# Patient Record
Sex: Female | Born: 1965
Health system: Southern US, Community
[De-identification: ages and names within clinical notes are randomized; demographics above are authoritative.]

## PROBLEM LIST (undated history)

## (undated) DIAGNOSIS — Z8619 Personal history of other infectious and parasitic diseases: Secondary | ICD-10-CM

## (undated) DIAGNOSIS — T7840XA Allergy, unspecified, initial encounter: Secondary | ICD-10-CM

## (undated) DIAGNOSIS — I341 Nonrheumatic mitral (valve) prolapse: Secondary | ICD-10-CM

## (undated) DIAGNOSIS — E119 Type 2 diabetes mellitus without complications: Secondary | ICD-10-CM

## (undated) DIAGNOSIS — O2230 Deep phlebothrombosis in pregnancy, unspecified trimester: Secondary | ICD-10-CM

## (undated) HISTORY — DX: Personal history of other infectious and parasitic diseases: Z86.19

## (undated) HISTORY — DX: Nonrheumatic mitral (valve) prolapse: I34.1

## (undated) HISTORY — DX: Allergy, unspecified, initial encounter: T78.40XA

## (undated) HISTORY — DX: Deep phlebothrombosis in pregnancy, unspecified trimester: O22.30

---

## 1998-06-11 DIAGNOSIS — O2230 Deep phlebothrombosis in pregnancy, unspecified trimester: Secondary | ICD-10-CM

## 1998-06-11 HISTORY — PX: OTHER SURGICAL HISTORY: SHX169

## 1998-06-11 HISTORY — DX: Deep phlebothrombosis in pregnancy, unspecified trimester: O22.30

## 2009-09-02 ENCOUNTER — Ambulatory Visit: Payer: Self-pay | Admitting: Family Medicine

## 2009-09-02 DIAGNOSIS — Z8672 Personal history of thrombophlebitis: Secondary | ICD-10-CM

## 2009-09-02 DIAGNOSIS — I8390 Asymptomatic varicose veins of unspecified lower extremity: Secondary | ICD-10-CM

## 2009-09-05 ENCOUNTER — Encounter: Payer: Self-pay | Admitting: Family Medicine

## 2009-09-05 ENCOUNTER — Encounter (INDEPENDENT_AMBULATORY_CARE_PROVIDER_SITE_OTHER): Payer: Self-pay | Admitting: *Deleted

## 2009-09-05 LAB — CONVERTED CEMR LAB
AST: 18 units/L (ref 0–37)
Albumin: 4.2 g/dL (ref 3.5–5.2)
Alkaline Phosphatase: 69 units/L (ref 39–117)
Calcium: 9.3 mg/dL (ref 8.4–10.5)
Chloride: 104 meq/L (ref 96–112)
Creatinine, Ser: 0.79 mg/dL (ref 0.40–1.20)
Potassium: 4.3 meq/L (ref 3.5–5.3)
Sodium: 139 meq/L (ref 135–145)
Total CHOL/HDL Ratio: 3
Triglycerides: 108 mg/dL (ref <150)
VLDL: 22 mg/dL (ref 0–40)

## 2009-09-22 ENCOUNTER — Encounter: Payer: Self-pay | Admitting: Family Medicine

## 2009-09-28 ENCOUNTER — Telehealth: Payer: Self-pay | Admitting: Family Medicine

## 2009-10-03 ENCOUNTER — Telehealth: Payer: Self-pay | Admitting: Family Medicine

## 2009-10-12 ENCOUNTER — Encounter: Admission: RE | Admit: 2009-10-12 | Discharge: 2009-10-12 | Payer: Self-pay | Admitting: Family Medicine

## 2010-01-19 ENCOUNTER — Encounter: Admission: RE | Admit: 2010-01-19 | Discharge: 2010-01-19 | Payer: Self-pay | Admitting: Obstetrics and Gynecology

## 2010-07-11 NOTE — Progress Notes (Signed)
Summary: referral to [facility] for vein and vascular  Phone Note Call from Patient Call back at [phone]   Caller: Patient Call For: Nani Gasser MD Summary of Call: Pt was seen at [facility] and once seen pt was told by MD that she needed ultrasound done. She works at [employer]  and said they could do it there and they alos have MD's there that can do vascular procedures as well. Initial call taken by: Kathlene November,  September 28, 2009 11:41 AM

## 2010-07-11 NOTE — Letter (Signed)
Summary: Primary Care Consult Scheduled Letter  [place] [facility]  [address]   [place], [place] [address]   Phone: [phone]  Fax: [phone]      09/05/2009 MRN: [id number]  Miranda Cabrera [address] [place], [place]  [address]    Dear Ms. Heiler,      We have scheduled an appointment for you.  At the recommendation of Dr.METHENEY , we have scheduled you a consult with [facility], DR Connye Burkitt on APRIL 8,2011 at 11AM .  Their address is_[address], [place]  [address]. The office phone number is [phone].  If this appointment day and time is not convenient for you, please feel free to call the office of the doctor you are being referred to at the number listed above and reschedule the appointment.     It is important for you to keep your scheduled appointments. We are here to make sure you are given good patient care. If you have questions or you have made changes to your appointment, please notify us at  [phone], ask for HELEN.    Thank you,  Patient Care Coordinator [place] [facility]

## 2010-07-11 NOTE — Progress Notes (Signed)
Summary: Order  Phone Note Call from Patient Call back at Work Phone  Call back at [phone]   Caller: Patient Call For: Nani Gasser MD Summary of Call: pt calls and got order for the ultrasound and they told her will need order for an ENM for vein treatment as well Initial call taken by: Kathlene November,  October 03, 2009 1:50 PM

## 2010-07-11 NOTE — Assessment & Plan Note (Signed)
Summary: NOV: Varicose veins, lipid   Vital Signs:  Patient profile:   45 year old female Height:      65 inches Weight:      212.25 pounds BMI:     35.45 Pulse rate:   71 / minute Pulse rhythm:   regular Resp:     16 per minute BP sitting:   107 / 65  (right arm) Cuff size:   regular  Vitals Entered By: Mervin Kung CMA (September 02, 2009 9:02 AM) Is Patient Diabetic? No   Primary Care Provider:  Nani Gasser MD   History of Present Illness: Veins do ache and hurt now.  Has one now that is protruding. 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 Still having a lot of pain and discomfort especially in her right leg.   Has never been screened with choleserol.    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   hx of shingles near her right eye.  (not in the eye).    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  Married to [place] with 2 kids.   Never Smoked Alcohol use-yes Drug use-no Regular exercise-yes 1 caffeinated drink per day. Smoking Status:  never Does Patient Exercise:  yes Drug Use:  no  Review of Systems       No fever/sweats/weakness, unexplained weight loss/gain.  No vison changes.  No difficulty hearing/ringing in ears, hay fever/allergies.  No chest pain/discomfort, palpitations.  No Br lump/nipple discharge.  No cough/wheeze.   No blood in BM, nausea/vomiting/diarrhea.  No nighttime urination, leaking urine, unusual vaginal bleeding, discharge (penis or vagina).  No muscle/joint pain. No rash, change in mole.  No HA, memory loss.  No anxiety, sleep d/o, depression.  No easy bruising/bleeding, unexplained lump   Physical Exam  General:  Well-developed,well-nourished,in no acute distress; alert,appropriate and cooperative throughout examination Neck:  No deformities, masses, or tenderness noted. No TM.  Lungs:  Normal respiratory effort, chest expands symmetrically. Lungs are clear to auscultation, no crackles or wheezes. Heart:  Normal rate and regular rhythm. S1 and S2 normal without gallop, murmur, click, rub or other extra sounds. Skin:  Has several spider veins on her anterior shin and has several larger bulging veins over her calf and right upper thigh.   Psych:  Cognition and judgment appear intact. Alert and cooperative with normal attention span and concentration. No apparent delusions, illusions, hallucinations   Impression & Recommendations:  Problem # 1:  SCREENING FOR LIPOID DISORDERS (ICD-V77.91) Has never had cholererol screen.  Orders: T-Lipid Profile [phone]) T-Comprehensive Metabolic Panel [phone])  Problem # 2:  VARICOSE VEINS, LOWER EXTREMITIES (ICD-454.9)  Will refer to [facility] for further evaluation and treatment. Recommend compression stockings for now.    Orders: Vascular Clinic (Vascular)  Complete Medication List: 1)  Lutera 0.1-20 Mg-mcg Tabs (Levonorgestrel-ethinyl estrad) .Marland KitchenMarland KitchenMarland Kitchen  Take 1 tablet by mouth once a day  Other Orders: Tdap => 12yrs IM (01601) Admin 1st Vaccine (09323)  Patient Instructions: 1)  FAsting labs anytime Mon-Fri 8AM- 5PM. FAst for 8 hours.    Vital Signs:  Patient Profile:   45 year old female Height:     65 inches Weight:      212.25 pounds BMI:     35.45 Pulse rate:   71 / minute Pulse rhythm:   regular Resp:     16 per  minute BP sitting:   107 / 65 Cuff size:   regular                 Current Allergies (reviewed today): No known allergies    Immunizations Administered:  Tetanus Vaccine:    Vaccine Type: Tdap    Site: left deltoid    Mfr: GlaxoSmithKline    Dose: 0.5 ml    Route: IM    Given by: Mervin Kung CMA    Exp. Date: 09/03/2011    Lot #: [id number]    VIS given: 04/29/07 version given September 02, 2009.     Preventive Care Screening  Last Tetanus Booster:    Date:  04/11/2009    Results:  Historical   Pap Smear:    Date:  10/11/2008    Results:  normal   Mammogram:    Date:  04/12/2008    Results:  normal    Flu Vaccine Result Date:  03/11/2009 Flu Vaccine Result:  given Flu Vaccine Next Due:  1 yr PAP Result Date:  10/09/2008 PAP Result:  normal PAP Next Due:  1 yr Mammogram Result Date:  04/11/2008 Mammogram Result:  normal Mammogram Next Due:  1 yr

## 2010-07-11 NOTE — Consult Note (Signed)
Summary: [facility]  [facility]   Imported By: Lanelle Bal 10/20/2009 12:50:53  _____________________________________________________________________  External Attachment:    Type:   Image     Comment:   External Document

## 2011-01-05 ENCOUNTER — Other Ambulatory Visit: Payer: Self-pay | Admitting: Obstetrics and Gynecology

## 2011-01-05 DIAGNOSIS — Z1231 Encounter for screening mammogram for malignant neoplasm of breast: Secondary | ICD-10-CM

## 2011-01-12 ENCOUNTER — Encounter: Payer: Self-pay | Admitting: Family Medicine

## 2011-01-12 ENCOUNTER — Ambulatory Visit (INDEPENDENT_AMBULATORY_CARE_PROVIDER_SITE_OTHER): Payer: PRIVATE HEALTH INSURANCE | Admitting: Family Medicine

## 2011-01-12 DIAGNOSIS — B009 Herpesviral infection, unspecified: Secondary | ICD-10-CM

## 2011-01-12 MED ORDER — VALACYCLOVIR HCL 1 G PO TABS: 1000.0000 mg | ORAL_TABLET | Freq: Three times a day (TID) | ORAL | Status: DC

## 2011-01-12 NOTE — Patient Instructions (Signed)
Go to the ED if start to notice lesion near the eye or any vision changes.

## 2011-01-12 NOTE — Progress Notes (Signed)
  Subjective:    Patient ID: Miranda Cabrera, female    DOB: 06/30/1965, 45 y.o.   MRN: [id number]  HPI 2 nights ago noticed two red spots near her RT eye. Then they blistered yesterday adn popped. Having tingling in that area, and over that side of her face,  and severe HA. Had similar lesions about 3 years ago in the exact same location and even had an eye exam that time and it was normal. It was not cultured at that time. She has an Audiological scientist that her dentist gave her. She had a root canal done this morning. She says she hasn't filled it yet but plans to. She denies any vision changes.   Review of Systems     Objective:   Physical Exam  Constitutional: She appears well-developed and well-nourished.  HENT:  Head: Normocephalic and atraumatic.  Eyes: Conjunctivae and EOM are normal. Pupils are equal, round, and reactive to light.  Skin:       At her right cheekbone she has 2 erythematous papules that are approximately half a centimeter. There appeared be dry and crusted over. There are no other lesions.          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 Unfortunately both vesicles s that are broken so we're unable to collect the culture sample. I let her know if this happens again tracked meanwhile the vesicles are still fresh and we will get a culture. In the meantime we'll go ahead and treat as if it is herpes zoster with Valtrex. If she starts to notice any lesions near her eye or starts notice any vision changes she is to emergency room immediately to have her eyes examined.

## 2011-01-22 ENCOUNTER — Ambulatory Visit
Admission: RE | Admit: 2011-01-22 | Discharge: 2011-01-22 | Disposition: A | Discharge status: Home / Self Care | Payer: PRIVATE HEALTH INSURANCE | Source: Ambulatory Visit | Attending: Obstetrics and Gynecology | Admitting: Obstetrics and Gynecology

## 2011-01-22 DIAGNOSIS — Z1231 Encounter for screening mammogram for malignant neoplasm of breast: Secondary | ICD-10-CM

## 2011-01-24 ENCOUNTER — Encounter: Payer: Self-pay | Admitting: Family Medicine

## 2011-02-01 ENCOUNTER — Encounter: Payer: Self-pay | Admitting: Family Medicine

## 2011-02-01 ENCOUNTER — Ambulatory Visit
Admission: RE | Admit: 2011-02-01 | Discharge: 2011-02-01 | Disposition: A | Discharge status: Home / Self Care | Payer: PRIVATE HEALTH INSURANCE | Source: Ambulatory Visit | Attending: Family Medicine | Admitting: Family Medicine

## 2011-02-01 ENCOUNTER — Ambulatory Visit (INDEPENDENT_AMBULATORY_CARE_PROVIDER_SITE_OTHER): Payer: PRIVATE HEALTH INSURANCE | Admitting: Family Medicine

## 2011-02-01 DIAGNOSIS — B360 Pityriasis versicolor: Secondary | ICD-10-CM

## 2011-02-01 DIAGNOSIS — R300 Dysuria: Secondary | ICD-10-CM

## 2011-02-01 DIAGNOSIS — M25559 Pain in unspecified hip: Secondary | ICD-10-CM

## 2011-02-01 DIAGNOSIS — Z0000 Encounter for general adult medical examination without abnormal findings: Secondary | ICD-10-CM

## 2011-02-01 LAB — POCT URINALYSIS DIPSTICK
Bilirubin, UA: NEGATIVE
Nitrite, UA: NEGATIVE
Spec Grav, UA: 1.02

## 2011-02-01 MED ORDER — KETOCONAZOLE 2 % EX CREA: TOPICAL_CREAM | Freq: Every day | CUTANEOUS | Status: AC

## 2011-02-01 NOTE — Patient Instructions (Addendum)
Keep up your regular exercise program and make sure you are eating a healthy diet Try to eat 4 servings of dairy a day or take a calcium supplement (500mg  twice a day).

## 2011-02-01 NOTE — Progress Notes (Signed)
Subjective:     Miranda Cabrera is a 45 y.o. female and is here for a comprehensive physical exam. The patient reports problems - tender spot on pelvic bone for years but has been getting worse. There for 12 yrs.  Says will be sitff in AM. No pain meds.  .Having moe frequency and urgency in the past year.  She also notes urinary frequency and urgency over the last year. It seems to be worse when she works out. No hematuria or fever. She has had 2 children. She is seeing her GYN tomorrow for her pelvic and breast exam.  History   Social History  . Marital Status: Married    Spouse Name: Keri    Number of Children: N/A  . Years of Education: N/A   Occupational History  . Not on file.   Social History Main Topics  . Smoking status: Never Smoker   . Smokeless tobacco: Not on file  . Alcohol Use: Yes  . Drug Use: No  . Sexually Active: Yes -- Female partner(s)   Other Topics Concern  . Not on file   Social History Narrative   Drinks maybe 1 caffeine. Exercises 3 x a week.    Health Maintenance  Topic Date Due  . Pap Smear  01/31/2014  . Tetanus/tdap  09/03/2019    The following portions of the patient's history were reviewed and updated as appropriate: allergies, current medications, past family history, past medical history, past social history and past surgical history.  Review of Systems A comprehensive review of systems was negative.   Objective:    BP 112/60  Pulse 73  Wt 128 lb (58.06 kg) General appearance: alert and cooperative Head: Normocephalic, without obvious abnormality, atraumatic Eyes: Conjunctiva are clear, extraocular movements intact, pupils equal round reactive to light. Ears: normal TM's and external ear canals both ears Nose: Nares normal. Septum midline. Mucosa normal. No drainage or sinus tenderness. Throat: lips, mucosa, and tongue normal; teeth and gums normal Neck: no adenopathy, no carotid bruit, supple, symmetrical, trachea midline and thyroid  not enlarged, symmetric, no tenderness/mass/nodules Back: symmetric, no curvature. ROM normal. No CVA tenderness. Lungs: clear to auscultation bilaterally Heart: regular rate and rhythm, S1, S2 normal, no murmur, click, rub or gallop Abdomen: soft, non-tender; bowel sounds normal; no masses,  no organomegaly Extremities: extremities normal, atraumatic, no cyanosis or edema Pulses: 2+ and symmetric Skin: She has a fine scaly erythematous rash in circular lesions on her upper back and on the top of her shoulders. Lymph nodes: Cervical adenopathy: Negative and Supraclavicular adenopathy: Negative Neurologic: Alert and oriented X 3, normal strength and tone. Normal symmetric reflexes. Normal coordination and gait  Left outer hip problems she does have a tender area, where I am able to palpate a groove. She has normal flexion and extension of her hip. No pain of the groin crease area.   Assessment:    Healthy female exam.      Plan:     See After Visit Summary for Counseling Recommendations   Will get flu shot at work Biometric testing through work is in October. She will fax copy to me. I will hold off on lab work today. Tinea versicolor.   Left outer hip pain-consider an avulsion fracture versus a partially torn tendon that didn't heal well. The pain has been there for the last 12 years but seems to be worse lately. We will get an x-ray to start to see if there is an avulsion fracture. This  is negative will refer to sports medicine for further evaluation. If she does have an avulsion fraction then I will speak with one of the orthopedist to see if they feel this is something that is amenable to surgical correction.  Dysuria-her urinalysis today was positive for blood. We will send a culture to confirm infection. I also recommend she discuss her symptoms with her GYN who is doing her pelvic exam tomorrow to see if she may have any evidence of prolapse etc.

## 2011-02-02 ENCOUNTER — Telehealth: Payer: Self-pay | Admitting: Family Medicine

## 2011-02-02 DIAGNOSIS — M25559 Pain in unspecified hip: Secondary | ICD-10-CM

## 2011-02-02 NOTE — Telephone Encounter (Signed)
Call pt: the xray really didn't get high enough to see the area of concern. Normal hip xray o/w.  Please call the the xray dept and tell them I need an xray ot the anterior superior iliac spine to r/o avulsion fracture. What view of xray would they recommmend.

## 2011-02-05 ENCOUNTER — Telehealth: Payer: Self-pay | Admitting: Family Medicine

## 2011-02-05 LAB — URINE CULTURE: Colony Count: 75000

## 2011-02-05 MED ORDER — NITROFURANTOIN MONOHYD MACRO 100 MG PO CAPS: 100.0000 mg | ORAL_CAPSULE | Freq: Two times a day (BID) | ORAL | Status: AC

## 2011-02-05 NOTE — Telephone Encounter (Signed)
Call pt: + uti. Will send over ABX.

## 2011-02-05 NOTE — Telephone Encounter (Signed)
New order placed

## 2011-02-05 NOTE — Telephone Encounter (Signed)
Mary at [facility] imaging said they need to do xray of pelvis

## 2011-02-07 ENCOUNTER — Other Ambulatory Visit: Payer: Self-pay | Admitting: Family Medicine

## 2011-02-07 ENCOUNTER — Telehealth: Payer: Self-pay | Admitting: Family Medicine

## 2011-02-07 ENCOUNTER — Ambulatory Visit
Admission: RE | Admit: 2011-02-07 | Discharge: 2011-02-07 | Disposition: A | Discharge status: Home / Self Care | Payer: PRIVATE HEALTH INSURANCE | Source: Ambulatory Visit | Attending: Family Medicine | Admitting: Family Medicine

## 2011-02-07 DIAGNOSIS — M25552 Pain in left hip: Secondary | ICD-10-CM

## 2011-02-07 NOTE — Telephone Encounter (Signed)
Pt called to inquire about her hip xray.  Told her normal, but that it looks like that the provider wanted add'l test ordered and since the pt works for [employer] she will check to see if she needs to do the xray of the pelvis. Jarvis Newcomer, LPN Domingo Dimes

## 2011-02-07 NOTE — Telephone Encounter (Signed)
Pt informed of her positive UTI.  Told pt she has a antibiotic that she needs to pup at her pharmacy for macrobid.  Tod pt of her hip xray and that it looks like they were trying to add add'l testing.  WIll call the pt back to let her know specifics about this. Jarvis Newcomer, LPN Domingo Dimes

## 2011-02-07 NOTE — Telephone Encounter (Signed)
Closed

## 2011-02-08 ENCOUNTER — Telehealth: Payer: Self-pay | Admitting: Family Medicine

## 2011-02-08 DIAGNOSIS — M25559 Pain in unspecified hip: Secondary | ICD-10-CM

## 2011-02-08 NOTE — Telephone Encounter (Signed)
Pt informed and had to [facility] that her xray was normal and the reason she hadn' gotten a call is because it did not go to the provider in basket as it should had.  Will refer to sports med and someone should call regarding the referral. Jarvis Newcomer, LPN Domingo Dimes

## 2011-02-08 NOTE — Telephone Encounter (Signed)
Call pt: Xray is normal. Sorry the new results didn't come to my in basket. . I want her to see sports med.  Will put in referral.

## 2011-02-08 NOTE — Telephone Encounter (Signed)
Pt called for her results of her recent xray pelvis. Plan:  Told the pt that it looks negative but there is not a telephone note documented yet where the pt has been called with the provider instructions.  Told the pt that someone should be calling her about that report.  Pt said if neg that Dr. Linford Arnold said that another route would be taken. Routed to Dr. Linford Arnold. Miranda Cabrera, Miranda Cabrera

## 2011-03-20 LAB — LIPID PANEL: Cholesterol: 219 mg/dL — AB (ref 0–200)

## 2011-03-20 LAB — BASIC METABOLIC PANEL: Glucose: 102 mg/dL

## 2011-03-22 ENCOUNTER — Encounter: Payer: Self-pay | Admitting: Family Medicine

## 2011-04-02 ENCOUNTER — Encounter: Payer: Self-pay | Admitting: Family Medicine

## 2011-04-02 ENCOUNTER — Inpatient Hospital Stay (INDEPENDENT_AMBULATORY_CARE_PROVIDER_SITE_OTHER)
Admission: RE | Admit: 2011-04-02 | Discharge: 2011-04-02 | Disposition: A | Discharge status: Home / Self Care | Payer: PRIVATE HEALTH INSURANCE | Source: Ambulatory Visit | Attending: Family Medicine | Admitting: Family Medicine

## 2011-04-02 DIAGNOSIS — J069 Acute upper respiratory infection, unspecified: Secondary | ICD-10-CM

## 2011-04-02 DIAGNOSIS — J029 Acute pharyngitis, unspecified: Secondary | ICD-10-CM

## 2011-04-02 LAB — CONVERTED CEMR LAB: Rapid Strep: NEGATIVE

## 2011-05-14 NOTE — Letter (Signed)
Summary: Out of Work  [facility]  [address]   [place], [place] [address]   Phone: [phone]  Fax: [phone]    April 02, 2011   Employee:  Tyson Dense    To Whom It May Concern:   For Medical reasons, please excuse the above named employee from work today.   If you need additional information, please feel free to contact our office.         Sincerely,    Donna Christen MD

## 2011-05-14 NOTE — Progress Notes (Signed)
Summary: sore throat (rm 5)   Vital Signs:  Patient Profile:   45 Years Old Female CC:      sore throat x 4 days, congestion Height:     64 inches Weight:      125 pounds O2 Sat:      100 % O2 treatment:    Room Air Temp:     98.8 degrees F oral Pulse rate:   77 / minute Resp:     14 per minute BP sitting:   113 / 74  (left arm) Cuff size:   regular  Vitals Entered By: Lajean Saver RN (April 02, 2011 10:56 AM)                  Updated Prior Medication List: LUTERA 0.1-20 MG-MCG TABS (LEVONORGESTREL-ETHINYL ESTRAD) Take 1 tablet by mouth once a day DETROL LA 2 MG XR24H-CAP (TOLTERODINE TARTRATE)   Current Allergies: No known allergies History of Present Illness Chief Complaint: sore throat x 4 days, congestion History of Present Illness:  Subjective: Patient complains of sore throat for 4 days.  "Worst sore throat I ever had." + mild cough No pleuritic pain No wheezing + nasal congestion ? post-nasal drainage No sinus pain/pressure No itchy/red eyes No earache No hemoptysis No SOB No fever/chills No nausea No vomiting No abdominal pain No diarrhea No skin rashes + fatigue + myalgias No headache Used OTC meds without relief   REVIEW OF SYSTEMS Constitutional Symptoms      Denies fever, chills, night sweats, weight loss, weight gain, and fatigue.  Eyes       Denies change in vision, eye pain, eye discharge, glasses, contact lenses, and eye surgery. Ear/Nose/Throat/Mouth       Complains of sinus problems and sore throat.      Denies hearing loss/aids, change in hearing, ear pain, ear discharge, dizziness, frequent runny nose, frequent nose bleeds, hoarseness, and tooth pain or bleeding.  Respiratory       Denies dry cough, productive cough, wheezing, shortness of breath, asthma, bronchitis, and emphysema/COPD.  Cardiovascular       Denies murmurs, chest pain, and tires easily with exhertion.    Gastrointestinal       Denies stomach pain,  nausea/vomiting, diarrhea, constipation, blood in bowel movements, and indigestion. Genitourniary       Denies painful urination, kidney stones, and loss of urinary control. Neurological       Denies paralysis, seizures, and fainting/blackouts. Musculoskeletal       Denies muscle pain, joint pain, joint stiffness, decreased range of motion, redness, swelling, muscle weakness, and gout.  Skin       Denies bruising, unusual mles/lumps or sores, and hair/skin or nail changes.  Psych       Denies mood changes, temper/anger issues, anxiety/stress, speech problems, depression, and sleep problems.  Past History:  Past Medical History: Reviewed history from 09/02/2009 and no changes required. MVP Hx of right leg DVT--2000 during second pregnancy and was in injectable heparin.   hx of shingles near her right eye.  (not in the eye).    Past Surgical History: Reviewed history from 09/02/2009 and no changes required. VEin removal in 2000  Family History: Reviewed history from 09/02/2009 and no changes required. MI--maternal / paternal grandparents; grandfather deceased Diabetes--father Stroke-- maternal grandmother Narcolepsy-- mother  Social History: Reviewed history from 09/02/2009 and no changes required. 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   Never Smoked Alcohol use-yes  Drug use-no Regular exercise-yes 1 caffeinated drink per day.    Objective:  Appearance:  Patient appears healthy, stated age, and in no acute distress  Eyes:  Pupils are equal, round, and reactive to light and accomodation.  Extraocular movement is intact.  Conjunctivae are not inflamed.  Ears:  Canals normal.  Tympanic membranes normal.   Nose:  Mildly congested turbinates.  No sinus tenderness  Pharynx:  Slightly erythematous on right Neck:  Supple.  Tender shotty posterior nodes are palpated bilaterally.  Lungs:  Clear to auscultation.  Breath sounds are equal.  Heart:  Regular  rate and rhythm without murmurs, rubs, or gallops.  Abdomen:  Nontender without masses or hepatosplenomegaly.  Bowel sounds are present.  No CVA or flank tenderness.  Rapid strep test negative  Assessment New Problems: UPPER RESPIRATORY INFECTION, ACUTE (ICD-465.9) ACUTE PHARYNGITIS (ICD-462)  NO EVIDENCE BACTERIAL INFECTION TODAY.  SUSPECT EARLY VIRAL URI  Plan New Medications/Changes: PREDNISONE 10 MG TABS (PREDNISONE) 2 PO BID for 2 days, then 1 BID for 2 days, then 1 daily for 2 days.  Take PC  #14 x 0, 04/02/2011, Donna Christen MD  New Orders: Rapid Strep [[id number]] T-Culture, Throat [[id number]] Pulse Oximetry (single measurment) [94760] New Patient Level III [09811] Planning Comments:   Throat culture pending. Treat symptomatically for now:  tapering course of prednisone.   The patient and/or caregiver has been counseled thoroughly with regard to medications prescribed including dosage, schedule, interactions, rationale for use, and possible side effects and they verbalize understanding.  Diagnoses and expected course of recovery discussed and will return if not improved as expected or if the condition worsens. Patient and/or caregiver verbalized understanding.  Prescriptions: PREDNISONE 10 MG TABS (PREDNISONE) 2 PO BID for 2 days, then 1 BID for 2 days, then 1 daily for 2 days.  Take PC  #14 x 0   Entered and Authorized by:   Donna Christen MD   Signed by:   Donna Christen MD on 04/02/2011   Method used:   Print then Give to Patient   RxID:   [id number]   Patient Instructions: 1)  If cold symptoms develop, take Mucinex D (guaifenesin with decongestant) twice daily for congestion. 2)  Increase fluid intake, rest. 3)  May use Afrin nasal spray (or generic oxymetazoline) twice daily for about 5 days.  Also recommend using saline nasal spray several times daily and/or saline nasal irrigation. 4)  Followup with family doctor if not improving 7 to 10 days.   Orders  Added: 1)  Rapid Strep [[id number]] 2)  T-Culture, Throat [[id number]] 3)  Pulse Oximetry (single measurment) [94760] 4)  New Patient Level III [99203]    Laboratory Results  Date/Time Received: April 02, 2011 11:15 AM  Date/Time Reported: April 02, 2011 11:14 AM   Other Tests  Rapid Strep: negative  Kit Test Internal QC: Negative   (Normal Range: Negative)

## 2011-08-31 ENCOUNTER — Encounter: Payer: Self-pay | Admitting: Family Medicine

## 2011-08-31 ENCOUNTER — Ambulatory Visit (INDEPENDENT_AMBULATORY_CARE_PROVIDER_SITE_OTHER): Payer: PRIVATE HEALTH INSURANCE | Admitting: Family Medicine

## 2011-08-31 VITALS — BP 107/69 | HR 106 | Temp 98.4°F | Ht 64.0 in | Wt 119.0 lb

## 2011-08-31 DIAGNOSIS — J309 Allergic rhinitis, unspecified: Secondary | ICD-10-CM

## 2011-08-31 DIAGNOSIS — J3489 Other specified disorders of nose and nasal sinuses: Secondary | ICD-10-CM

## 2011-08-31 DIAGNOSIS — R0981 Nasal congestion: Secondary | ICD-10-CM

## 2011-08-31 MED ORDER — AZELASTINE HCL 0.15 % NA SOLN: 2.0000 | Freq: Two times a day (BID) | NASAL | Status: DC

## 2011-08-31 MED ORDER — FLUTICASONE PROPIONATE 50 MCG/ACT NA SUSP: 2.0000 | Freq: Every day | NASAL | Status: DC

## 2011-08-31 NOTE — Patient Instructions (Signed)
Allergic Rhinitis  Allergic rhinitis is when the mucous membranes in the nose respond to allergens. Allergens are particles in the air that cause your body to have an allergic reaction. This causes you to release allergic antibodies. Through a chain of events, these eventually cause you to release histamine into the blood stream (hence the use of antihistamines). Although meant to be protective to the body, it is this release that causes your discomfort, such as frequent sneezing, congestion and an itchy runny nose.    CAUSES    The pollen allergens may come from grasses, trees, and weeds. This is seasonal allergic rhinitis, or "hay fever." Other allergens cause year-round allergic rhinitis (perennial allergic rhinitis) such as house dust mite allergen, pet dander and mold spores.    SYMPTOMS     Nasal stuffiness (congestion).   Runny, itchy nose with sneezing and tearing of the eyes.   There is often an itching of the mouth, eyes and ears.  It cannot be cured, but it can be controlled with medications.  DIAGNOSIS    If you are unable to determine the offending allergen, skin or blood testing may find it.  TREATMENT     Avoid the allergen.   Medications and allergy shots (immunotherapy) can help.   Hay fever may often be treated with antihistamines in pill or nasal spray forms. Antihistamines block the effects of histamine. There are over-the-counter medicines that may help with nasal congestion and swelling around the eyes. Check with your caregiver before taking or giving this medicine.  If the treatment above does not work, there are many new medications your caregiver can prescribe. Stronger medications may be used if initial measures are ineffective. Desensitizing injections can be used if medications and avoidance fails. Desensitization is when a patient is given ongoing shots until the body becomes less sensitive to the allergen. Make sure you follow up with your caregiver if problems continue.  SEEK  MEDICAL CARE IF:     You develop fever (more than 100.5 F (38.1 C).   You develop a cough that does not stop easily (persistent).   You have shortness of breath.   You start wheezing.   Symptoms interfere with normal daily activities.  Document Released: 02/20/2001 Document Revised: 05/17/2011 Document Reviewed: 09/01/2008  [employer] Patient Information 2012 [employer].

## 2011-08-31 NOTE — Progress Notes (Signed)
  Subjective:    Patient ID: Miranda Cabrera, female    DOB: 12-31-1965, 46 y.o.   MRN: [id number]  HPI Sinus congestion start in October and had alotof nasal congestion but says now she is stil lcongestion but no drianage. Says can't breath at all through either nostrils. No fever.  No ST or LN. Went to [facility] in October and was treated with antiobiotics.  Has been worse since things are blooming. Has been using Affrin for almost a month. Worse when traveled to [place]. Taking claritin. Has tried saline nasal spray.  Nose felt sore and bloody.     Review of Systems     Objective:   Physical Exam  Constitutional: She is oriented to person, place, and time. She appears well-developed and well-nourished.  HENT:  Head: Normocephalic and atraumatic.  Right Ear: External ear normal.  Left Ear: External ear normal.  Nose: Nose normal.  Mouth/Throat: Oropharynx is clear and moist.       TMs and canals are clear. Turbinates are swollen.   Eyes: Conjunctivae and EOM are normal. Pupils are equal, round, and reactive to light.  Neck: Neck supple. No thyromegaly present.  Cardiovascular: Normal rate, regular rhythm and normal heart sounds.   Pulmonary/Chest: Effort normal and breath sounds normal. She has no wheezes.  Lymphadenopathy:    She has no cervical adenopathy.  Neurological: She is alert and oriented to person, place, and time.  Skin: Skin is warm and dry.  Psychiatric: She has a normal mood and affect.          Assessment & Plan:  AR - Will change to Allegra or Zyrtec. Add fluticasone nasal steorid spray and given sample for Astepro. If not sig better in one week then call and consider workup for chronic sinusitis. Stop the affrin - we discussed the rebound phenomenon. Discussed avoidance measures. Consider allergy testing.

## 2011-09-05 ENCOUNTER — Telehealth: Payer: Self-pay | Admitting: *Deleted

## 2011-09-05 NOTE — Telephone Encounter (Signed)
Pt states that she is 90% and feels the treatment is working. Pt states that she doesn't feel she needs the XR you mentioned.  FYI

## 2011-09-05 NOTE — Telephone Encounter (Signed)
OK 

## 2012-01-09 ENCOUNTER — Other Ambulatory Visit: Payer: Self-pay | Admitting: Obstetrics and Gynecology

## 2012-01-09 DIAGNOSIS — Z1231 Encounter for screening mammogram for malignant neoplasm of breast: Secondary | ICD-10-CM

## 2012-01-23 ENCOUNTER — Ambulatory Visit
Admission: RE | Admit: 2012-01-23 | Discharge: 2012-01-23 | Disposition: A | Discharge status: Home / Self Care | Payer: PRIVATE HEALTH INSURANCE | Source: Ambulatory Visit | Attending: Obstetrics and Gynecology | Admitting: Obstetrics and Gynecology

## 2012-01-23 DIAGNOSIS — Z1231 Encounter for screening mammogram for malignant neoplasm of breast: Secondary | ICD-10-CM

## 2012-02-04 ENCOUNTER — Ambulatory Visit (INDEPENDENT_AMBULATORY_CARE_PROVIDER_SITE_OTHER): Payer: PRIVATE HEALTH INSURANCE | Admitting: Family Medicine

## 2012-02-04 ENCOUNTER — Encounter: Payer: Self-pay | Admitting: Family Medicine

## 2012-02-04 VITALS — BP 104/67 | HR 60 | Wt 117.0 lb

## 2012-02-04 DIAGNOSIS — Z0000 Encounter for general adult medical examination without abnormal findings: Secondary | ICD-10-CM

## 2012-02-04 NOTE — Progress Notes (Addendum)
  Subjective:     Miranda Cabrera is a 46 y.o. female and is here for a comprehensive physical exam. The patient reports no problems. She istrainig for 5K. Came off her OCP 6 months ago. Hasn't restarted her period yet but feels like she is going to start. Having breast tenderness. Not pregnant. Husband had a vasectomy.   History   Social History  . Marital Status: Married    Spouse Name: Keri    Number of Children: 2  . Years of Education: N/A   Occupational History  . EXECUTIVE ASSISTANT    Social History Main Topics  . Smoking status: Never Smoker   . Smokeless tobacco: Not on file  . Alcohol Use: Yes     rarely  . Drug Use: No  . Sexually Active: Yes -- Female partner(s)   Other Topics Concern  . Not on file   Social History Narrative   Drinks maybe 1 caffeine. Exercises 3 x a week. Training for a 5K.     Health Maintenance  Topic Date Due  . Influenza Vaccine  03/11/2012  . Pap Smear  01/31/2014  . Tetanus/tdap  09/03/2019    The following portions of the patient's history were reviewed and updated as appropriate: allergies, current medications, past family history, past medical history, past social history, past surgical history and problem list.  Review of Systems A comprehensive review of systems was negative.   Objective:    BP 104/67  Pulse 60  Wt 117 lb (53.071 kg)  LMP 08/31/2011 General appearance: alert, cooperative and appears stated age Head: Normocephalic, without obvious abnormality, atraumatic Eyes: conj clear, EOMi, PEERLA Ears: normal TM's and external ear canals both ears Nose: Nares normal. Septum midline. Mucosa normal. No drainage or sinus tenderness. Throat: lips, mucosa, and tongue normal; teeth and gums normal Neck: no adenopathy, no carotid bruit, no JVD, supple, symmetrical, trachea midline and thyroid not enlarged, symmetric, no tenderness/mass/nodules Back: symmetric, no curvature. ROM normal. No CVA tenderness. Lungs: clear to  auscultation bilaterally Breasts: normal appearance, no masses or tenderness Heart: regular rate and rhythm, S1, S2 normal, no murmur, click, rub or gallop Abdomen: soft, non-tender; bowel sounds normal; no masses,  no organomegaly Extremities: extremities normal, atraumatic, no cyanosis or edema Pulses: 2+ and symmetric Skin: Skin color, texture, turgor normal. No rashes or lesions Lymph nodes: Cervical, supraclavicular, and axillary nodes normal. Neurologic: Alert and oriented X 3, normal strength and tone. Normal symmetric reflexes. Normal coordination and gait    Assessment:    Healthy female exam.      Plan:     See After Visit Summary for Counseling Recommendations  Start a regular exercise program and make sure you are eating a healthy diet Try to eat 4 servings of dairy a day or take a calcium supplement (500mg  twice a day). Your vaccines are up to date.  Due for CMP, lipids.

## 2012-02-04 NOTE — Patient Instructions (Signed)
Keep up your regular exercise program and make sure you are eating a healthy diet Try to eat 4 servings of dairy a day. Your vaccines are up to date.   

## 2012-03-01 LAB — COMPLETE METABOLIC PANEL WITH GFR
ALT: 14 U/L (ref 0–35)
Alkaline Phosphatase: 74 U/L (ref 39–117)
Calcium: 9.1 mg/dL (ref 8.4–10.5)
Creat: 0.81 mg/dL (ref 0.50–1.10)
GFR, Est African American: >89 mL/min
GFR, Est Non African American: 89 mL/min
Total Bilirubin: 1 mg/dL (ref 0.3–1.2)
Total Protein: 6.7 g/dL (ref 6.0–8.3)

## 2012-03-01 LAB — LIPID PANEL
HDL: 66 mg/dL (ref >39)
Total CHOL/HDL Ratio: 2.5 Ratio
VLDL: 17 mg/dL (ref 0–40)

## 2012-03-03 ENCOUNTER — Telehealth: Payer: Self-pay | Admitting: *Deleted

## 2012-03-03 ENCOUNTER — Encounter: Payer: Self-pay | Admitting: *Deleted

## 2012-03-03 NOTE — Telephone Encounter (Signed)
Left message on vm with pt's lab results and mailed letter

## 2012-04-18 ENCOUNTER — Ambulatory Visit (INDEPENDENT_AMBULATORY_CARE_PROVIDER_SITE_OTHER): Payer: PRIVATE HEALTH INSURANCE | Admitting: Family Medicine

## 2012-04-18 ENCOUNTER — Encounter: Payer: Self-pay | Admitting: Family Medicine

## 2012-04-18 VITALS — BP 109/57 | HR 67 | Ht 63.0 in | Wt 122.0 lb

## 2012-04-18 DIAGNOSIS — R002 Palpitations: Secondary | ICD-10-CM

## 2012-04-18 DIAGNOSIS — I341 Nonrheumatic mitral (valve) prolapse: Secondary | ICD-10-CM | POA: Insufficient documentation

## 2012-04-18 DIAGNOSIS — I059 Rheumatic mitral valve disease, unspecified: Secondary | ICD-10-CM

## 2012-04-18 NOTE — Patient Instructions (Addendum)
We will call you with your lab results. If you don't here from us in about a week then please give us a call at [phone].  

## 2012-04-18 NOTE — Progress Notes (Signed)
  Subjective:    Patient ID: Miranda Cabrera, female    DOB: [birth year], 46 y.o.   MRN: [id number]  HPI Palpitations started On Saturday ( 7 days ago). Says happened almost all day yesterday and felt flushed in her face but o/w felt great.  Says today has only felt them a few times but feels very exhausted today.  Last a few seconds. No change in caffeine intake. Drinks maybe 1 cup of coffee in AM if that at all.  No tea or soda.  No decongestants. No energy drinks or supplements.  Had similar sxs in college and was placed on inderal for years. She does have a strong history of heart disease in her family.   Review of Systems. Family History  Problem Relation Age of Onset  . Heart disease Maternal Grandmother     MI  . Heart disease Maternal Grandfather     MI  . Heart disease Paternal Grandmother     MI  . Heart disease Paternal Grandfather     MI  . Diabetes Father   . Stroke Maternal Grandmother     x2  . Narcolepsy Mother   . Liver disease Brother     fatty liver?  . Liver disease Sister     from birth control     Review of systems: No chest pain or shortness of breath. No radiation to arms. No lightheadedness or dizziness.    Objective:   Physical Exam  Constitutional: She is oriented to person, place, and time. She appears well-developed and well-nourished.  HENT:  Head: Normocephalic and atraumatic.  Neck: Neck supple. No thyromegaly present.  Cardiovascular: Normal rate, regular rhythm and normal heart sounds.        No carotid bruits. Radial pulses 2+ bilaterally.  Pulmonary/Chest: Effort normal and breath sounds normal.  Musculoskeletal: She exhibits no edema.  Lymphadenopathy:    She has no cervical adenopathy.  Neurological: She is alert and oriented to person, place, and time.  Skin: Skin is warm and dry.  Psychiatric: She has a normal mood and affect. Her behavior is normal.          Assessment & Plan:  Palpitations-unclear etiology. Certainly this  could be PVCs. I must wonder if that's what she had back in college when she had similar symptoms and she was placed on Inderal. She said she came off of it after several years but is not sure why. She says she may just continue taking it. There seemed to be no triggers such as caffeine or stress etc. I would like check her thyroid and rule out anemia. Her EKG shows a rate of 65 beats per minute, normal sinus rhythm, normal axis, no acute ST-T wave changes. Certainly if she starts to experience shortness of breath or chest pain or actual dizziness from the palpitation she needs to go to the emergency department over the weekend. If her blood work comes back completely normal but I like to set her up for a Holter monitor to try to capture the episodes.

## 2012-04-21 ENCOUNTER — Other Ambulatory Visit: Payer: Self-pay | Admitting: Family Medicine

## 2012-04-21 DIAGNOSIS — R002 Palpitations: Secondary | ICD-10-CM

## 2012-04-21 LAB — COMPLETE METABOLIC PANEL WITH GFR
AST: 16 U/L (ref 0–37)
BUN: 13 mg/dL (ref 6–23)
GFR, Est African American: >89 mL/min
GFR, Est Non African American: >89 mL/min
Glucose, Bld: 92 mg/dL (ref 70–99)
Potassium: 4.3 mEq/L (ref 3.5–5.3)
Sodium: 139 mEq/L (ref 135–145)

## 2012-04-21 LAB — CBC WITH DIFFERENTIAL/PLATELET
Basophils Relative: 1 % (ref 0–1)
MCHC: 32.7 g/dL (ref 30.0–36.0)
MCV: 95.1 fL (ref 78.0–100.0)
Neutro Abs: 4.8 10*3/uL (ref 1.7–7.7)
Neutrophils Relative %: 60 % (ref 43–77)
Platelets: 307 10*3/uL (ref 150–400)
RDW: 13.2 % (ref 11.5–15.5)

## 2012-04-22 ENCOUNTER — Encounter: Payer: Self-pay | Admitting: *Deleted

## 2012-04-28 ENCOUNTER — Ambulatory Visit (HOSPITAL_COMMUNITY): Payer: PRIVATE HEALTH INSURANCE | Attending: Cardiology

## 2012-04-28 ENCOUNTER — Encounter (INDEPENDENT_AMBULATORY_CARE_PROVIDER_SITE_OTHER): Payer: PRIVATE HEALTH INSURANCE

## 2012-04-28 DIAGNOSIS — I059 Rheumatic mitral valve disease, unspecified: Secondary | ICD-10-CM | POA: Insufficient documentation

## 2012-04-28 DIAGNOSIS — R002 Palpitations: Secondary | ICD-10-CM | POA: Insufficient documentation

## 2012-04-28 DIAGNOSIS — Z8249 Family history of ischemic heart disease and other diseases of the circulatory system: Secondary | ICD-10-CM | POA: Insufficient documentation

## 2012-04-28 NOTE — Progress Notes (Signed)
Echocardiogram performed.  

## 2012-04-29 ENCOUNTER — Encounter: Payer: Self-pay | Admitting: Family Medicine

## 2012-04-29 ENCOUNTER — Telehealth: Payer: Self-pay | Admitting: *Deleted

## 2012-04-29 NOTE — Telephone Encounter (Signed)
Left message on vm

## 2012-04-29 NOTE — Telephone Encounter (Signed)
Yes, see results notes for results of echo.  I am glad she is having the flutters on the monitor that way wer are capturing what is happening, which is great! Schedule f/U in 2 weeks so we can go over event monitor results. Hopefully we will have it back by then.

## 2012-04-29 NOTE — Telephone Encounter (Signed)
You should be receiving the results to my echocardiogram performed on 04/28/12. I may be reached on my cell at [phone] anytime. I am feeling fatigued and having many heart flutters, wearing event monitor now.   Thanks, [employer]

## 2012-05-01 ENCOUNTER — Telehealth: Payer: Self-pay | Admitting: *Deleted

## 2012-05-01 NOTE — Telephone Encounter (Signed)
Pt was notified about her echocardiogram results and she already has the event monitor on now.

## 2012-05-15 ENCOUNTER — Telehealth: Payer: Self-pay

## 2012-05-15 ENCOUNTER — Ambulatory Visit: Payer: PRIVATE HEALTH INSURANCE | Admitting: Family Medicine

## 2012-05-15 NOTE — Telephone Encounter (Signed)
FYI  Follow up appointment for cardiac monitor results and shortness of breath scheduled.   Reham was rescheduled with Dr Ivan Anchors. She did not want to wait for Dr Linford Arnold to return from vacation.

## 2012-05-15 NOTE — Telephone Encounter (Signed)
I still don't have final result. Needs to Reschedule.

## 2012-05-15 NOTE — Telephone Encounter (Signed)
Miranda Cabrera has an appointment this afternoon to review results of the Holter monitor. Did you receive the results? If not we can reschedule, or if you know the name of the company I can call for results. I am unable to see referral.

## 2012-05-20 ENCOUNTER — Ambulatory Visit (INDEPENDENT_AMBULATORY_CARE_PROVIDER_SITE_OTHER): Payer: PRIVATE HEALTH INSURANCE | Admitting: Family Medicine

## 2012-05-20 ENCOUNTER — Encounter: Payer: Self-pay | Admitting: Family Medicine

## 2012-05-20 VITALS — BP 105/67 | HR 62 | Wt 121.0 lb

## 2012-05-20 DIAGNOSIS — R5381 Other malaise: Secondary | ICD-10-CM

## 2012-05-20 DIAGNOSIS — R0602 Shortness of breath: Secondary | ICD-10-CM

## 2012-05-20 DIAGNOSIS — I493 Ventricular premature depolarization: Secondary | ICD-10-CM | POA: Insufficient documentation

## 2012-05-20 DIAGNOSIS — I4949 Other premature depolarization: Secondary | ICD-10-CM

## 2012-05-20 DIAGNOSIS — R5383 Other fatigue: Secondary | ICD-10-CM | POA: Insufficient documentation

## 2012-05-20 MED ORDER — PROPRANOLOL HCL 40 MG PO TABS: ORAL_TABLET | ORAL | Status: DC

## 2012-05-20 NOTE — Progress Notes (Signed)
CC: Miranda Cabrera is a 46 y.o. female is here for f/u heart monitor   Subjective: HPI:  Patient presents for followup of palpitations, specifically here to review an event monitor worn earlier this month. While wearing the event monitor she had multiple episodes of shortness of breath, fatigue that occurred in conjunction with palpitations. The symptoms she was experiencing during the event monitor were identical to those which prompted the ordering of the event monitor. The event monitor showed multiple single PVCs without any other arrhythmia. Recent studies also included a normal echocardiogram, normal TSH, normal CMP, and no anemia.  Nothing in particular seems to make palpitations better or worse, they're present all hours of the day, they are not waking her from sleep.  The patient becomes tearful when she describes sensations of fatigue and shortness of breath but become more noticeable ever since the palpitations began about a month ago. She gets short of breath with climbing a flight of stairs and states she's short of breath while talking to me during our encounter.  She describes the fatigue that sets in 2-3 hours after waking up from a restorative sleep, she becomes tearful when describing that she feels she has to go to bed at 8 or 9 PM in the evening because of being fatigued during the day.   She denies cough, fever, wheezing, chest pain, chest heaviness, abdominal pain, nor abdominal bloating. She denies unilateral leg swelling or discoloration    Review Of Systems Outlined In HPI  Past Medical History  Diagnosis Date  . MVP (mitral valve prolapse)   . DVT (deep vein thrombosis) in pregnancy 2000    was on injectible heparin  . History of shingles     near right eye     Family History  Problem Relation Age of Onset  . Heart disease Maternal Grandmother     MI  . Heart disease Maternal Grandfather     MI  . Heart disease Paternal Grandmother     MI  . Heart disease  Paternal Grandfather     MI  . Diabetes Father   . Stroke Maternal Grandmother     x2  . Narcolepsy Mother   . Liver disease Brother     fatty liver?  . Liver disease Sister     from birth control     History  Substance Use Topics  . Smoking status: Never Smoker   . Smokeless tobacco: Not on file  . Alcohol Use: Yes     Comment: rarely     Objective: Filed Vitals:   05/20/12 1559  BP: 105/67  Pulse: 62    General: Alert and Oriented, No Acute Distress HEENT: Pupils equal, round, reactive to light. Conjunctivae clear.    Neck supple without palpable lymphadenopathy nor abnormal masses. Lungs: Clear to auscultation bilaterally, no wheezing/ronchi/rales.  Comfortable work of breathing. Good air movement. Cardiac: Regular rate and rhythm. Normal S1/S2.  No murmurs, rubs, nor gallops.  No carotid bruits Extremities: No peripheral edema.  Strong peripheral pulses.  Mental Status: No depression, anxiety, nor agitation. Skin: Warm and dry.  Assessment & Plan: Prestyn was seen today for f/u heart monitor.  Diagnoses and associated orders for this visit:  Pvc (premature ventricular contraction) - propranolol (INDERAL) 40 MG tablet; One by mouth twice a day for control of palpitations.  Fatigue  Shortness of breath    Discussed with the patient that her event monitor showed PVCs and that starting Inderal would be purely for  minimizing the frequency of her palpitations that appear to take quite a bit of her tension. Discussed that restarting Inderal may exacerbate her fatigue I like to try the lowest dose possible.  Discussed the possibility of working her up for a PE however she has not been tachycardic at her last 2 visits and sats are appropriate today. Discussed the possibility of getting PFTs however clinical suspicion of lung disease is low. Asked to follow up with Dr. Judie Petit. in 1-2 weeks after starting Inderal 25 minutes spent face-to-face during visit today of which at least  50% was counseling or coordinating care.   Return in about 2 weeks (around 06/03/2012).

## 2012-05-24 IMAGING — CR DG HIP (WITH OR WITHOUT PELVIS) 2-3V*L*
2 series · 2 of 2 positions shown · non-contrast
Comparison: None

CLINICAL DATA: Left hip pain.

LEFT HIP - COMPLETE 2+ VIEW

[view not recorded (1 of 2)]
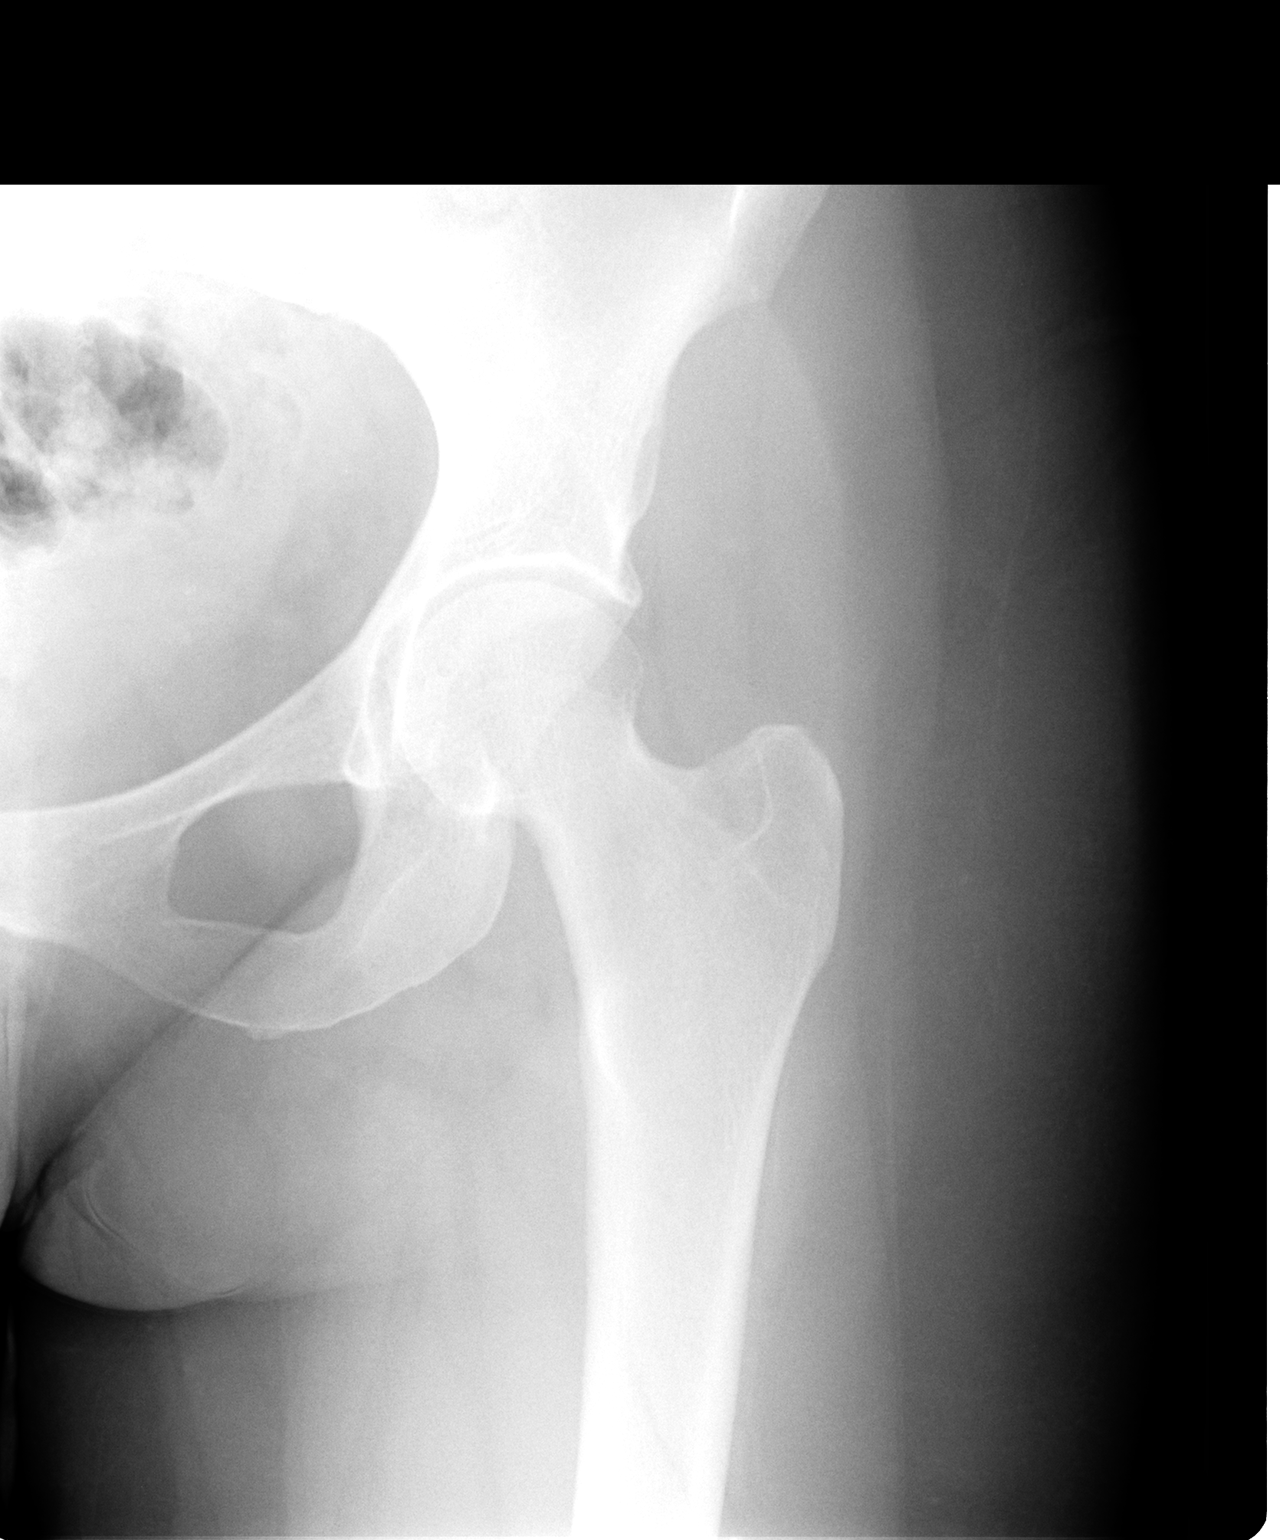

[view not recorded (2 of 2)]
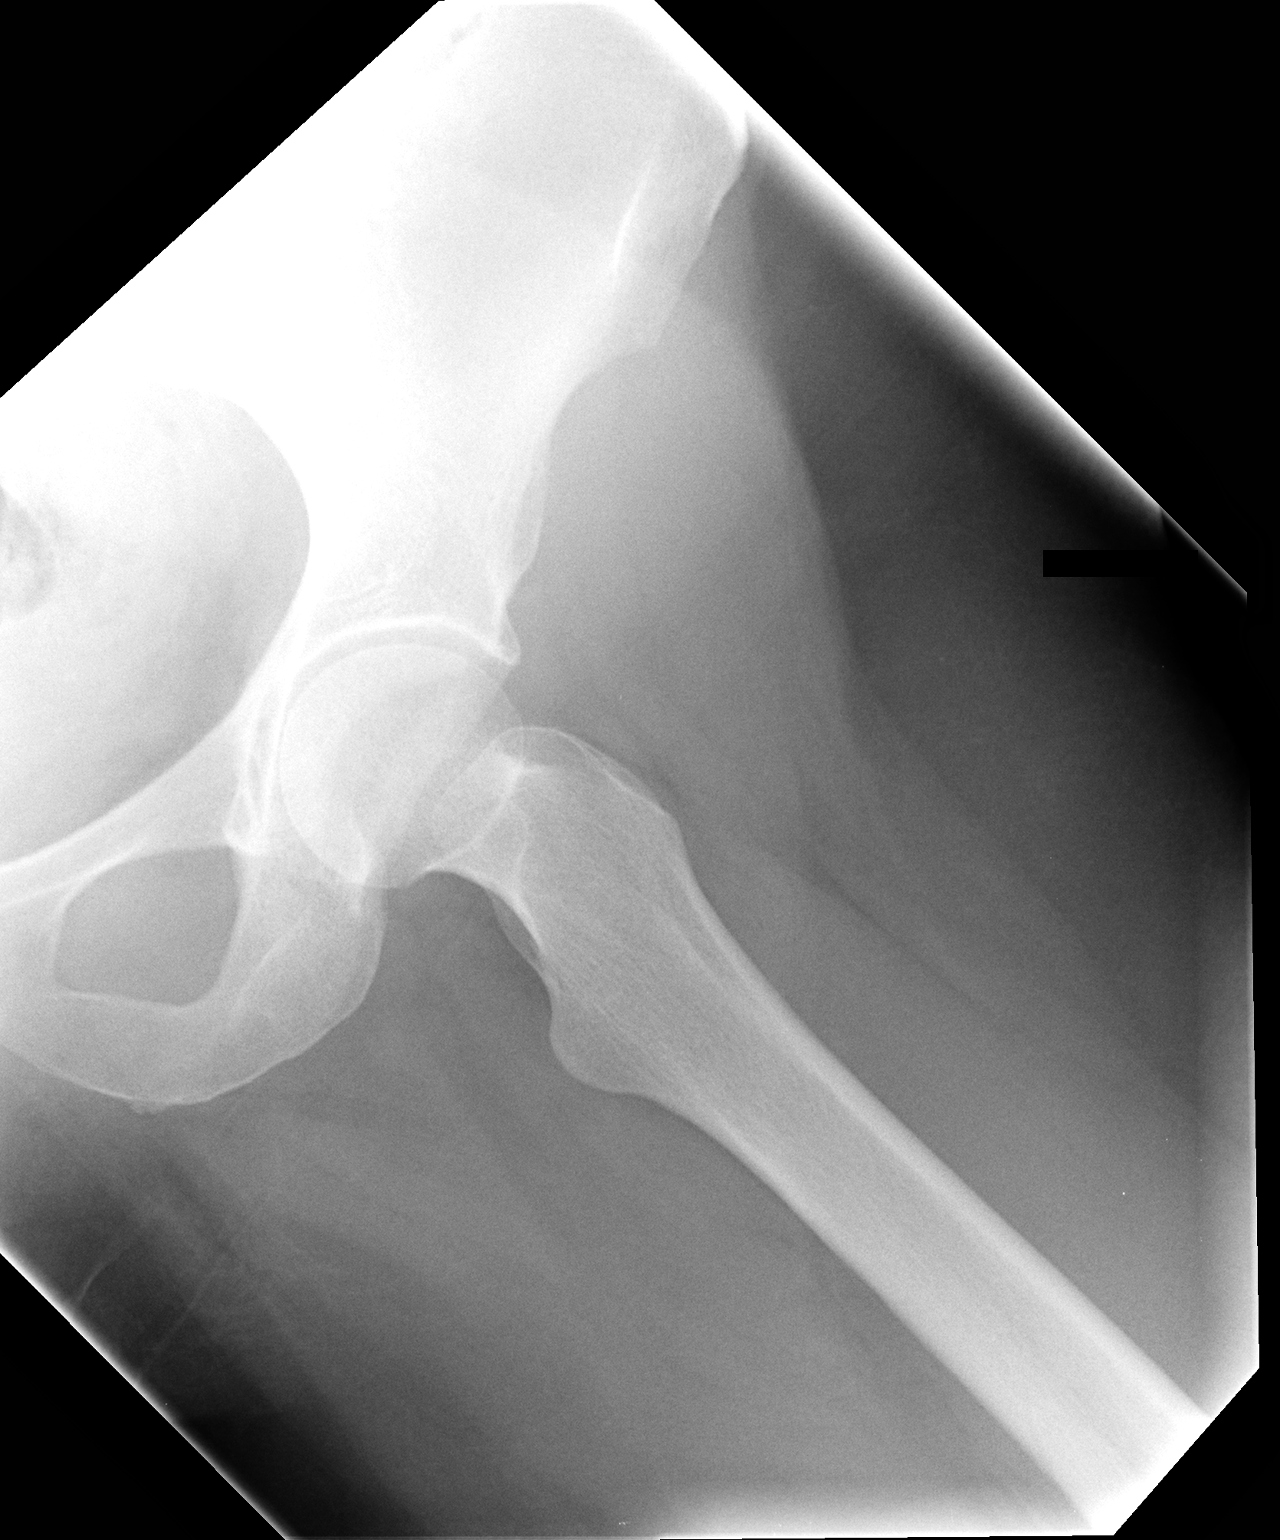

[2 of 2 positions shown; findings below may reference images not displayed]

FINDINGS: The joint spaces maintained.  No acute bony findings or
plain film evidence of avascular necrosis.
IMPRESSION: No acute bony findings.

## 2012-06-02 ENCOUNTER — Encounter: Payer: Self-pay | Admitting: Family Medicine

## 2012-07-04 ENCOUNTER — Ambulatory Visit (INDEPENDENT_AMBULATORY_CARE_PROVIDER_SITE_OTHER): Payer: PRIVATE HEALTH INSURANCE | Admitting: Cardiology

## 2012-07-04 ENCOUNTER — Encounter: Payer: Self-pay | Admitting: Cardiology

## 2012-07-04 VITALS — BP 98/76 | HR 57 | Ht 63.0 in | Wt 124.0 lb

## 2012-07-04 DIAGNOSIS — R079 Chest pain, unspecified: Secondary | ICD-10-CM

## 2012-07-04 DIAGNOSIS — I341 Nonrheumatic mitral (valve) prolapse: Secondary | ICD-10-CM

## 2012-07-04 DIAGNOSIS — I059 Rheumatic mitral valve disease, unspecified: Secondary | ICD-10-CM

## 2012-07-04 DIAGNOSIS — R0602 Shortness of breath: Secondary | ICD-10-CM

## 2012-07-04 DIAGNOSIS — R0600 Dyspnea, unspecified: Secondary | ICD-10-CM

## 2012-07-04 DIAGNOSIS — R002 Palpitations: Secondary | ICD-10-CM

## 2012-07-04 DIAGNOSIS — R0609 Other forms of dyspnea: Secondary | ICD-10-CM

## 2012-07-04 LAB — BASIC METABOLIC PANEL
Calcium: 9.4 mg/dL (ref 8.4–10.5)
Chloride: 105 mEq/L (ref 96–112)
GFR: 94.72 mL/min (ref >60.00)
Glucose, Bld: 103 mg/dL — ABNORMAL HIGH (ref 70–99)
Potassium: 4.2 mEq/L (ref 3.5–5.1)

## 2012-07-04 LAB — D-DIMER, QUANTITATIVE: D-Dimer, Quant: <0.27 ug/mL-FEU (ref 0.00–0.48)

## 2012-07-04 MED ORDER — METOPROLOL SUCCINATE ER 25 MG PO TB24: 25.0000 mg | ORAL_TABLET | Freq: Every day | ORAL | Status: DC

## 2012-07-04 NOTE — Assessment & Plan Note (Signed)
Echocardiogram shows mitral valve prolapse with mild mitral regurgitation.

## 2012-07-04 NOTE — Progress Notes (Signed)
  HPI: 47 year old female for evaluation of palpitations. CardioNet in November of 2013 showed sinus rhythm with PACs and PVCs. Echocardiogram in November of 2013 showed normal LV function. There was mild mitral valve prolapse with mild mitral regurgitation. TSH in November of 2013 was 1.645. Potassium 4.3. Patient has had intermittent palpitations for quite some time. They worsened in November. They are described as a skip followed by a flushing sensation. No syncope. She has also noticed dyspnea on exertion but no orthopnea, PND, pedal edema or exertional chest pain. Note she has not traveled recently. No recent surgeries.  Current Outpatient Prescriptions  Medication Sig Dispense Refill  . fluticasone (FLONASE) 50 MCG/ACT nasal spray Place 2 sprays into the nose daily.      . propranolol (INDERAL) 40 MG tablet One by mouth twice a day for control of palpitations.  60 tablet  1    No Known Allergies  Past Medical History  Diagnosis Date  . MVP (mitral valve prolapse)   . DVT (deep vein thrombosis) in pregnancy 2000    was on injectible heparin  . History of shingles     near right eye    Past Surgical History  Procedure Date  . Vein removal 2000    History   Social History  . Marital Status: Married    Spouse Name: Keri    Number of Children: 2  . Years of Education: N/A   Occupational History  . EXECUTIVE ASSISTANT    Social History Main Topics  . Smoking status: Never Smoker   . Smokeless tobacco: Not on file  . Alcohol Use: Yes     Comment: rarely  . Drug Use: No  . Sexually Active: Yes -- Female partner(s)   Other Topics Concern  . Not on file   Social History Narrative   Drinks maybe 1 caffeine. Exercises 3 x a week. Training for a 5K.      Family History  Problem Relation Age of Onset  . Heart disease Maternal Grandmother     MI  . Heart disease Maternal Grandfather     MI  . Heart disease Paternal Grandmother     MI  . Heart disease Paternal  Grandfather     MI  . Diabetes Father   . Stroke Maternal Grandmother     x2  . Narcolepsy Mother   . Liver disease Brother     fatty liver?  . Liver disease Sister     from birth control    ROS: no fevers or chills, productive cough, hemoptysis, dysphasia, odynophagia, melena, hematochezia, dysuria, hematuria, rash, seizure activity, orthopnea, PND, pedal edema, claudication. Remaining systems are negative.  Physical Exam:   Blood pressure 98/76, pulse 57, height 5\' 3"  (1.6 m), weight 124 lb (56.246 kg), SpO2 99.00%.  General:  Well developed/well nourished in NAD Skin warm/dry Patient not depressed No peripheral clubbing Back-normal HEENT-normal/normal eyelids Neck supple/normal carotid upstroke bilaterally; no bruits; no JVD; no thyromegaly chest - CTA/ normal expansion CV - RRR/normal S1 and S2; no rubs or gallops;  PMI nondisplaced, 2/6 systolic murmur apex Abdomen -NT/ND, no HSM, no mass, + bowel sounds, no bruit 2+ femoral pulses, no bruits Ext-no edema, chords, 2+ DP Neuro-grossly nonfocal  ECG 04/18/2012-sinus rhythm with no ST changes.

## 2012-07-04 NOTE — Assessment & Plan Note (Signed)
Etiology unclear. Not volume overloaded on examination. Her LV function normal. Check d-dimer and BNP.

## 2012-07-04 NOTE — Patient Instructions (Addendum)
Your physician recommends that you schedule a follow-up appointment in: 8 WEEKS WITH DR CRENSHAW  STOP INDERAL  START METOPROLOL SUCC 25 MG ONCE DAILY  Your physician recommends that you HAVE LAB WORK TODAY

## 2012-07-04 NOTE — Assessment & Plan Note (Signed)
Patient complains of palpitations and previous monitor showed PACs and PVCs. She did have symptoms with a monitor in place. There was some improvement with Inderal. However she has noticed some fatigue with this medication. I will discontinue it instead treat with a more selective beta blocker. Begin Toprol 25 mg daily.

## 2012-07-26 ENCOUNTER — Other Ambulatory Visit: Payer: Self-pay

## 2012-08-25 ENCOUNTER — Ambulatory Visit (INDEPENDENT_AMBULATORY_CARE_PROVIDER_SITE_OTHER): Payer: PRIVATE HEALTH INSURANCE | Admitting: Cardiology

## 2012-08-25 ENCOUNTER — Encounter: Payer: Self-pay | Admitting: Cardiology

## 2012-08-25 VITALS — BP 96/57 | HR 56 | Ht 63.0 in | Wt 123.1 lb

## 2012-08-25 DIAGNOSIS — R0602 Shortness of breath: Secondary | ICD-10-CM

## 2012-08-25 MED ORDER — METOPROLOL SUCCINATE ER 25 MG PO TB24: 25.0000 mg | ORAL_TABLET | Freq: Every day | ORAL | Status: DC

## 2012-08-25 NOTE — Assessment & Plan Note (Signed)
Improved; continue toprol which is causing much less fatigue than nonselective beta blocker.

## 2012-08-25 NOTE — Patient Instructions (Addendum)
Your physician wants you to follow-up in: ONE YEAR WITH DR CRENSHAW You will receive a reminder letter in the mail two months in advance. If you don't receive a letter, please call our office to schedule the follow-up appointment.  

## 2012-08-25 NOTE — Assessment & Plan Note (Signed)
Noted on most recent echo.

## 2012-08-25 NOTE — Assessment & Plan Note (Signed)
Resolved; previous DDimer normal

## 2012-08-25 NOTE — Progress Notes (Signed)
   HPI: 47 year old female I initially saw in Jan 2014 for evaluation of palpitations. CardioNet in November of 2013 showed sinus rhythm with PACs and PVCs. Echocardiogram in November of 2013 showed normal LV function. There was mild mitral valve prolapse with mild mitral regurgitation. TSH in November of 2013 was 1.645. Potassium 4.3. When I saw her previously, we changed her beta blocker to toprol. Ddimer normal. Since we saw her previously, her palpitations are much improved; no dyspnea or chest pain; no syncope.    Current Outpatient Prescriptions  Medication Sig Dispense Refill  . fluticasone (FLONASE) 50 MCG/ACT nasal spray Place 2 sprays into the nose daily.      . metoprolol succinate (TOPROL-XL) 25 MG 24 hr tablet Take 1 tablet (25 mg total) by mouth daily.  90 tablet  4   No current facility-administered medications for this visit.     Past Medical History  Diagnosis Date  . MVP (mitral valve prolapse)   . DVT (deep vein thrombosis) in pregnancy 2000    was on injectible heparin  . History of shingles     near right eye    Past Surgical History  Procedure Laterality Date  . Vein removal  2000    History   Social History  . Marital Status: Married    Spouse Name: Keri    Number of Children: 2  . Years of Education: N/A   Occupational History  . EXECUTIVE ASSISTANT    Social History Main Topics  . Smoking status: Never Smoker   . Smokeless tobacco: Not on file  . Alcohol Use: Yes     Comment: rarely  . Drug Use: No  . Sexually Active: Yes -- Female partner(s)   Other Topics Concern  . Not on file   Social History Narrative   Drinks maybe 1 caffeine. Exercises 3 x a week. Training for a 5K.      ROS: no fevers or chills, productive cough, hemoptysis, dysphasia, odynophagia, melena, hematochezia, dysuria, hematuria, rash, seizure activity, orthopnea, PND, pedal edema, claudication. Remaining systems are negative.  Physical Exam: Well-developed  well-nourished in no acute distress.  Skin is warm and dry.  HEENT is normal.  Neck is supple.  Chest is clear to auscultation with normal expansion.  Cardiovascular exam is regular rate and rhythm.  Abdominal exam nontender or distended. No masses palpated. Extremities show no edema. neuro grossly intact

## 2012-09-25 ENCOUNTER — Other Ambulatory Visit: Payer: Self-pay | Admitting: Family Medicine

## 2012-12-23 ENCOUNTER — Other Ambulatory Visit: Payer: Self-pay

## 2012-12-23 DIAGNOSIS — Z1231 Encounter for screening mammogram for malignant neoplasm of breast: Secondary | ICD-10-CM

## 2013-01-26 ENCOUNTER — Ambulatory Visit: Payer: PRIVATE HEALTH INSURANCE

## 2013-02-02 ENCOUNTER — Ambulatory Visit
Admission: RE | Admit: 2013-02-02 | Discharge: 2013-02-02 | Disposition: A | Discharge status: Home / Self Care | Payer: PRIVATE HEALTH INSURANCE | Source: Ambulatory Visit

## 2013-02-02 DIAGNOSIS — Z1231 Encounter for screening mammogram for malignant neoplasm of breast: Secondary | ICD-10-CM

## 2013-04-16 ENCOUNTER — Other Ambulatory Visit: Payer: Self-pay

## 2013-05-10 ENCOUNTER — Encounter: Payer: Self-pay | Admitting: Emergency Medicine

## 2013-05-10 ENCOUNTER — Emergency Department
Admission: EM | Admit: 2013-05-10 | Discharge: 2013-05-10 | Disposition: A | Discharge status: Home / Self Care | Payer: PRIVATE HEALTH INSURANCE | Source: Home / Self Care | Attending: Family Medicine | Admitting: Family Medicine

## 2013-05-10 DIAGNOSIS — B029 Zoster without complications: Secondary | ICD-10-CM

## 2013-05-10 MED ORDER — VALACYCLOVIR HCL 1 G PO TABS: 1000.0000 mg | ORAL_TABLET | Freq: Three times a day (TID) | ORAL | Status: DC

## 2013-05-10 NOTE — ED Notes (Signed)
Patient c/o rash on nose patient believes it is shingles patient states she has had it in the past.

## 2013-05-10 NOTE — ED Provider Notes (Signed)
CSN: [id number]     Arrival date & time 05/10/13  1159 History   First MD Initiated Contact with Patient 05/10/13 1316     Chief Complaint  Patient presents with  . Herpes Zoster      HPI Comments: Patient complains of a two day history of painful, tingling rash on the right aspect of her nose, and is concerned that it may be shingles because she has had it in the past.  There has been no involvement of her eyes.  She feels well otherwise except for insomnia the past 4 nights.  Patient is a 47 y.o. female presenting with rash. The history is provided by the patient.  Rash Pain location: right nose. Pain quality comment:  Tingling Pain radiates to:  Does not radiate Pain severity:  Mild Onset quality:  Sudden Duration:  2 days Timing:  Constant Progression:  Worsening Chronicity:  New Context: awakening from sleep   Context: not recent illness and not sick contacts   Relieved by:  Nothing Worsened by:  Nothing tried Ineffective treatments:  None tried Associated symptoms: no anorexia, no chills, no cough, no diarrhea, no fatigue, no fever, no nausea, no sore throat and no vomiting     Past Medical History  Diagnosis Date  . MVP (mitral valve prolapse)   . DVT (deep vein thrombosis) in pregnancy 2000    was on injectible heparin  . History of shingles     near right eye   Past Surgical History  Procedure Laterality Date  . Vein removal  2000   Family History  Problem Relation Age of Onset  . Heart disease Maternal Grandmother     MI  . Heart disease Maternal Grandfather     MI  . Heart disease Paternal Grandmother     MI  . Heart disease Paternal Grandfather     MI  . Diabetes Father   . Stroke Maternal Grandmother     x2  . Narcolepsy Mother   . Liver disease Brother     fatty liver?  . Liver disease Sister     from birth control   History  Substance Use Topics  . Smoking status: Never Smoker   . Smokeless tobacco: Not on file  . Alcohol Use: Yes   Comment: rarely   OB History   Grav Para Term Preterm Abortions TAB SAB Ect Mult Living                 Review of Systems  Constitutional: Negative for fever, chills and fatigue.  HENT: Negative for sore throat.   Respiratory: Negative for cough.   Gastrointestinal: Negative for nausea, vomiting, diarrhea and anorexia.  Skin: Positive for rash.  All other systems reviewed and are negative.    Allergies  Review of patient's allergies indicates no known allergies.  Home Medications   Current Outpatient Rx  Name  Route  Sig  Dispense  Refill  . fluticasone (FLONASE) 50 MCG/ACT nasal spray   Nasal   Place 2 sprays into the nose daily.         . fluticasone (FLONASE) 50 MCG/ACT nasal spray      PLACE 2 SPRAY IN EACH NOSTRIL DAILY   16 g   3   . metoprolol succinate (TOPROL-XL) 25 MG 24 hr tablet   Oral   Take 1 tablet (25 mg total) by mouth daily.   90 tablet   4   . valACYclovir (VALTREX) 1000 MG tablet  Oral   Take 1 tablet (1,000 mg total) by mouth 3 (three) times daily.   21 tablet   0    BP 103/67  Pulse 51  Temp(Src) 97.8 F (36.6 C) (Oral)  Resp 16  Ht 5\' 3"  (1.6 m)  Wt 123 lb (55.792 kg)  BMI 21.79 kg/m2  SpO2 100% Physical Exam  Nursing note and vitals reviewed. Constitutional: She is oriented to person, place, and time. She appears well-developed and well-nourished. No distress.  HENT:  Head: Normocephalic and atraumatic.  Right Ear: External ear normal.  Left Ear: External ear normal.  Nose:    On the right aspect of nose as noted on diagram is a1.5cm dia patch of erythema without swelling or tenderness.  Lesion is slightly vesicular in appearance.  Eyes: Conjunctivae and EOM are normal. Pupils are equal, round, and reactive to light. Right eye exhibits no discharge. Left eye exhibits no discharge.  Neck: Neck supple.  Cardiovascular: Normal rate.   Pulmonary/Chest: Breath sounds normal.  Lymphadenopathy:    She has no cervical  adenopathy.  Neurological: She is alert and oriented to person, place, and time.  Skin: Rash noted.    ED Course  Procedures  none       MDM   1. Herpes zoster    Begin Valtrex Followup with ophthalmologist if eye redness or irritation occurs.    Lattie Haw, MD 05/11/13 1550

## 2013-07-13 ENCOUNTER — Encounter: Payer: Self-pay | Admitting: Cardiology

## 2013-07-13 ENCOUNTER — Ambulatory Visit (INDEPENDENT_AMBULATORY_CARE_PROVIDER_SITE_OTHER): Payer: PRIVATE HEALTH INSURANCE | Admitting: Cardiology

## 2013-07-13 VITALS — BP 90/60 | HR 54 | Ht 63.0 in | Wt 123.4 lb

## 2013-07-13 DIAGNOSIS — R0989 Other specified symptoms and signs involving the circulatory and respiratory systems: Secondary | ICD-10-CM

## 2013-07-13 DIAGNOSIS — R002 Palpitations: Secondary | ICD-10-CM

## 2013-07-13 DIAGNOSIS — I341 Nonrheumatic mitral (valve) prolapse: Secondary | ICD-10-CM

## 2013-07-13 DIAGNOSIS — I059 Rheumatic mitral valve disease, unspecified: Secondary | ICD-10-CM

## 2013-07-13 DIAGNOSIS — R079 Chest pain, unspecified: Secondary | ICD-10-CM

## 2013-07-13 DIAGNOSIS — R0600 Dyspnea, unspecified: Secondary | ICD-10-CM

## 2013-07-13 DIAGNOSIS — R0609 Other forms of dyspnea: Secondary | ICD-10-CM

## 2013-07-13 MED ORDER — METOPROLOL SUCCINATE ER 25 MG PO TB24: 25.0000 mg | ORAL_TABLET | Freq: Every day | ORAL | Status: DC

## 2013-07-13 NOTE — Patient Instructions (Signed)
Your physician wants you to follow-up in: ONE YEAR WITH DR CRENSHAW You will receive a reminder letter in the mail two months in advance. If you don't receive a letter, please call our office to schedule the follow-up appointment.  

## 2013-07-13 NOTE — Assessment & Plan Note (Signed)
Symptoms are controlled with Toprol. I will continue.

## 2013-07-13 NOTE — Assessment & Plan Note (Signed)
Mild mitral regurgitation on most recent echo. 

## 2013-07-13 NOTE — Progress Notes (Signed)
      HPI: FU palpitations. CardioNet in November of 2013 showed sinus rhythm with PACs and PVCs. Echocardiogram in November of 2013 showed normal LV function. There was mild mitral valve prolapse with mild mitral regurgitation. TSH in November of 2013 was 1.645. Potassium 4.3. I last saw her in March of 2014. Since then, the patient denies any dyspnea on exertion, orthopnea, PND, pedal edema, palpitations, syncope or chest pain.    Current Outpatient Prescriptions  Medication Sig Dispense Refill  . fluticasone (FLONASE) 50 MCG/ACT nasal spray Place 2 sprays into the nose daily.      . metoprolol succinate (TOPROL-XL) 25 MG 24 hr tablet Take 1 tablet (25 mg total) by mouth daily.  90 tablet  4  . valACYclovir (VALTREX) 1000 MG tablet Take 1 tablet (1,000 mg total) by mouth 3 (three) times daily.  21 tablet  0   No current facility-administered medications for this visit.     Past Medical History  Diagnosis Date  . MVP (mitral valve prolapse)   . DVT (deep vein thrombosis) in pregnancy 2000    was on injectible heparin  . History of shingles     near right eye    Past Surgical History  Procedure Laterality Date  . Vein removal  2000    History   Social History  . Marital Status: Married    Spouse Name: Keri    Number of Children: 2  . Years of Education: N/A   Occupational History  . EXECUTIVE ASSISTANT    Social History Main Topics  . Smoking status: Never Smoker   . Smokeless tobacco: Not on file  . Alcohol Use: Yes     Comment: rarely  . Drug Use: No  . Sexual Activity: Yes    Partners: Male   Other Topics Concern  . Not on file   Social History Narrative   Drinks maybe 1 caffeine. Exercises 3 x a week. Training for a 5K.      ROS: no fevers or chills, productive cough, hemoptysis, dysphasia, odynophagia, melena, hematochezia, dysuria, hematuria, rash, seizure activity, orthopnea, PND, pedal edema, claudication. Remaining systems are negative.  Physical  Exam: Well-developed well-nourished in no acute distress.  Skin is warm and dry.  HEENT is normal.  Neck is supple.  Chest is clear to auscultation with normal expansion.  Cardiovascular exam is regular rate and rhythm.  Abdominal exam nontender or distended. No masses palpated. Extremities show no edema. neuro grossly intact  ECG in sinus rhythm at a rate of 54. RV conduction delay.

## 2014-01-01 ENCOUNTER — Other Ambulatory Visit: Payer: Self-pay

## 2014-01-01 DIAGNOSIS — Z1231 Encounter for screening mammogram for malignant neoplasm of breast: Secondary | ICD-10-CM

## 2014-02-05 ENCOUNTER — Ambulatory Visit
Admission: RE | Admit: 2014-02-05 | Discharge: 2014-02-05 | Disposition: A | Discharge status: Home / Self Care | Payer: PRIVATE HEALTH INSURANCE | Source: Ambulatory Visit

## 2014-02-05 DIAGNOSIS — Z1231 Encounter for screening mammogram for malignant neoplasm of breast: Secondary | ICD-10-CM

## 2015-01-04 ENCOUNTER — Other Ambulatory Visit: Payer: Self-pay

## 2015-01-04 DIAGNOSIS — Z1231 Encounter for screening mammogram for malignant neoplasm of breast: Secondary | ICD-10-CM

## 2015-02-07 ENCOUNTER — Ambulatory Visit
Admission: RE | Admit: 2015-02-07 | Discharge: 2015-02-07 | Disposition: A | Discharge status: Home / Self Care | Payer: PRIVATE HEALTH INSURANCE | Source: Ambulatory Visit

## 2015-02-07 DIAGNOSIS — Z1231 Encounter for screening mammogram for malignant neoplasm of breast: Secondary | ICD-10-CM

## 2015-09-05 ENCOUNTER — Emergency Department
Admission: EM | Admit: 2015-09-05 | Discharge: 2015-09-05 | Disposition: A | Discharge status: Home / Self Care | Payer: PRIVATE HEALTH INSURANCE | Source: Home / Self Care | Attending: Family Medicine | Admitting: Family Medicine

## 2015-09-05 ENCOUNTER — Encounter: Payer: Self-pay | Admitting: *Deleted

## 2015-09-05 DIAGNOSIS — H6692 Otitis media, unspecified, left ear: Secondary | ICD-10-CM | POA: Diagnosis not present

## 2015-09-05 DIAGNOSIS — J069 Acute upper respiratory infection, unspecified: Secondary | ICD-10-CM

## 2015-09-05 DIAGNOSIS — B9789 Other viral agents as the cause of diseases classified elsewhere: Principal | ICD-10-CM

## 2015-09-05 DIAGNOSIS — H6592 Unspecified nonsuppurative otitis media, left ear: Secondary | ICD-10-CM

## 2015-09-05 LAB — POCT RAPID STREP A (OFFICE): Rapid Strep A Screen: NEGATIVE

## 2015-09-05 MED ORDER — BENZONATATE 200 MG PO CAPS: 200.0000 mg | ORAL_CAPSULE | Freq: Every day | ORAL | Status: DC

## 2015-09-05 MED ORDER — AMOXICILLIN 875 MG PO TABS: 875.0000 mg | ORAL_TABLET | Freq: Two times a day (BID) | ORAL | Status: DC

## 2015-09-05 NOTE — Discharge Instructions (Signed)

## 2015-09-05 NOTE — ED Provider Notes (Signed)
CSN: [id number]     Arrival date & time 09/05/15  [id number] History   First MD Initiated Contact with Patient 09/05/15 1102     Chief Complaint  Patient presents with  . Sore Throat  . Fever      HPI Comments: Six days ago patient developed typical cold-like symptoms developing over several days,  including mild sore throat, sinus congestion, headache, fatigue, and cough.  Yesterday she developed fever to 102, and today developed left earache.  The history is provided by the patient.    Past Medical History  Diagnosis Date  . MVP (mitral valve prolapse)   . DVT (deep vein thrombosis) in pregnancy 2000    was on injectible heparin  . History of shingles     near right eye   Past Surgical History  Procedure Laterality Date  . Vein removal  2000   Family History  Problem Relation Age of Onset  . Heart disease Maternal Grandmother     MI  . Heart disease Maternal Grandfather     MI  . Heart disease Paternal Grandmother     MI  . Heart disease Paternal Grandfather     MI  . Diabetes Father   . Stroke Maternal Grandmother     x2  . Narcolepsy Mother   . Liver disease Brother     fatty liver?  . Liver disease Sister     from birth control   Social History  Substance Use Topics  . Smoking status: Never Smoker   . Smokeless tobacco: None  . Alcohol Use: Yes     Comment: rarely   OB History    No data available     Review of Systems + sore throat + cough No pleuritic pain No wheezing + nasal congestion + post-nasal drainage No sinus pain/pressure No itchy/red eyes + left earache No hemoptysis No SOB + fever, + chills No nausea No vomiting No abdominal pain No diarrhea No urinary symptoms No skin rash + fatigue + myalgias + headache Used OTC meds without relief  Allergies  Review of patient's allergies indicates no known allergies.  Home Medications   Prior to Admission medications   Medication Sig Start Date End Date Taking? Authorizing Provider   amoxicillin (AMOXIL) 875 MG tablet Take 1 tablet (875 mg total) by mouth 2 (two) times daily. 09/05/15   Kandra Nicolas, MD  benzonatate (TESSALON) 200 MG capsule Take 1 capsule (200 mg total) by mouth at bedtime. Take as needed for cough 09/05/15   Kandra Nicolas, MD  fluticasone [facility]) 50 MCG/ACT nasal spray Place 2 sprays into the nose daily.    Historical Provider, MD  metoprolol succinate (TOPROL-XL) 25 MG 24 hr tablet Take 1 tablet (25 mg total) by mouth daily. 07/13/13   Lelon Perla, MD   Meds Ordered and Administered this Visit  Medications - No data to display  BP 104/68 mmHg  Pulse 75  Temp(Src) 98.6 F (37 C) (Oral)  Resp 16  Ht 5\' 3"  (1.6 m)  Wt 121 lb (54.885 kg)  BMI 21.44 kg/m2  SpO2 97%  LMP 02/04/2014 No data found.   Physical Exam Nursing notes and Vital Signs reviewed. Appearance:  Patient appears stated age, and in no acute distress Eyes:  Pupils are equal, round, and reactive to light and accomodation.  Extraocular movement is intact.  Conjunctivae are not inflamed  Ears:  Canals normal.  Right tympanic membrane normal.  Left tympanic membrane erythematous with  effusion. Nose:  Congested turbinates.  No sinus tenderness.    Pharynx:  Normal Neck:  Supple.  Tender enlarged posterior/lateral nodes are palpated bilaterally  Lungs:  Clear to auscultation.  Breath sounds are equal.  Moving air well. Heart:  Regular rate and rhythm without murmurs, rubs, or gallops.  Abdomen:  Nontender without masses or hepatosplenomegaly.  Bowel sounds are present.  No CVA or flank tenderness.  Extremities:  No edema.  Skin:  No rash present.   ED Course  Procedures none    Labs Reviewed  POCT RAPID STREP A (OFFICE) negative     MDM   1. Viral URI with cough   2. Left otitis media with effusion    Begin amoxicillin 875mg  BID Prescription written for Benzonatate (Tessalon) to take at bedtime for night-time cough.  Take plain guaifenesin (1200mg  extended release  tabs such as Mucinex) twice daily, with plenty of water, for cough and congestion.  May add Pseudoephedrine (30mg , one or two every 4 to 6 hours) for sinus congestion.  Get adequate rest.   May use Afrin nasal spray (or generic oxymetazoline) twice daily for about 5 days and then discontinue.  Also recommend using saline nasal spray several times daily and saline nasal irrigation (AYR is a common brand).  Use Flonase nasal spray each morning after using Afrin nasal spray and saline nasal irrigation. Try warm salt water gargles for sore throat.  Stop all antihistamines for now, and other non-prescription cough/cold preparations.   Follow-up with family doctor if not improving about one week.    Kandra Nicolas, MD 09/08/15 [phone]

## 2015-09-05 NOTE — ED Notes (Signed)
Pt c/o 5 days of sore throat, dry cough and congestion with fever, t-max 102. Taken Mucinex D otc.

## 2016-01-02 ENCOUNTER — Other Ambulatory Visit: Payer: Self-pay | Admitting: Obstetrics and Gynecology

## 2016-01-02 DIAGNOSIS — Z1231 Encounter for screening mammogram for malignant neoplasm of breast: Secondary | ICD-10-CM

## 2016-02-08 ENCOUNTER — Ambulatory Visit
Admission: RE | Admit: 2016-02-08 | Discharge: 2016-02-08 | Disposition: A | Discharge status: Home / Self Care | Payer: PRIVATE HEALTH INSURANCE | Source: Ambulatory Visit | Attending: Obstetrics and Gynecology | Admitting: Obstetrics and Gynecology

## 2016-02-08 DIAGNOSIS — Z1231 Encounter for screening mammogram for malignant neoplasm of breast: Secondary | ICD-10-CM

## 2016-08-06 ENCOUNTER — Encounter: Payer: Self-pay | Admitting: *Deleted

## 2016-08-06 ENCOUNTER — Emergency Department (INDEPENDENT_AMBULATORY_CARE_PROVIDER_SITE_OTHER)
Admission: EM | Admit: 2016-08-06 | Discharge: 2016-08-06 | Disposition: A | Discharge status: Home / Self Care | Payer: PRIVATE HEALTH INSURANCE | Source: Home / Self Care | Attending: Family Medicine | Admitting: Family Medicine

## 2016-08-06 DIAGNOSIS — R300 Dysuria: Secondary | ICD-10-CM

## 2016-08-06 DIAGNOSIS — N3000 Acute cystitis without hematuria: Secondary | ICD-10-CM

## 2016-08-06 LAB — POCT URINALYSIS DIP (MANUAL ENTRY)
Bilirubin, UA: NEGATIVE
Glucose, UA: NEGATIVE
Ketones, POC UA: NEGATIVE
Nitrite, UA: NEGATIVE
Protein Ur, POC: NEGATIVE
Spec Grav, UA: 1.015 (ref 1.005–1.03)
Urobilinogen, UA: 0.2 (ref 0–1)
pH, UA: 7.5 (ref 5–8)

## 2016-08-06 MED ORDER — PHENAZOPYRIDINE HCL 200 MG PO TABS: 200.0000 mg | ORAL_TABLET | Freq: Three times a day (TID) | ORAL | 0 refills | Status: DC

## 2016-08-06 MED ORDER — NITROFURANTOIN MONOHYD MACRO 100 MG PO CAPS: 100.0000 mg | ORAL_CAPSULE | Freq: Two times a day (BID) | ORAL | 0 refills | Status: DC

## 2016-08-06 NOTE — ED Provider Notes (Signed)
[facility]    CSN: [id number] Arrival date & time: 08/06/16  1523     History   Chief Complaint Chief Complaint  Patient presents with  . Urinary Tract Infection    HPI Miranda Cabrera is a 51 y.o. female.   Patient awoke yesterday with frequency and dysuria, now worse today.  No fevers, chills, and sweats.  No abdominal or pelvic pain.   The history is provided by the patient.  Urinary Tract Infection  Pain quality:  Burning Pain severity:  Mild Onset quality:  Sudden Duration:  1 day Timing:  Constant Progression:  Worsening Chronicity:  New Recent urinary tract infections: no   Relieved by:  None tried Worsened by:  Nothing Ineffective treatments:  None tried Urinary symptoms: frequent urination and hesitancy   Urinary symptoms: no discolored urine, no foul-smelling urine, no hematuria and no bladder incontinence   Associated symptoms: no abdominal pain, no fever, no flank pain, no genital lesions, no nausea, no vaginal discharge and no vomiting     Past Medical History:  Diagnosis Date  . DVT (deep vein thrombosis) in pregnancy ([place]) 2000   was on injectible heparin  . History of shingles    near right eye  . MVP (mitral valve prolapse)     Patient Active Problem List   Diagnosis Date Noted  . Palpitations 07/04/2012  . PVC (premature ventricular contraction) 05/20/2012  . Fatigue 05/20/2012  . Shortness of breath 05/20/2012  . MVP (mitral valve prolapse) 04/18/2012  . VARICOSE VEINS, LOWER EXTREMITIES 09/02/2009  . DEEP VENOUS THROMBOPHLEBITIS, HX OF 09/02/2009    Past Surgical History:  Procedure Laterality Date  . vein removal  2000    OB History    No data available       Home Medications    Prior to Admission medications   Medication Sig Start Date End Date Taking? Authorizing Provider  loratadine (CLARITIN) 10 MG tablet Take 10 mg by mouth daily.   Yes Historical Provider, MD  metoprolol succinate (TOPROL-XL) 25 MG 24 hr  tablet Take 25 mg by mouth daily.   Yes Historical Provider, MD  vitamin C (ASCORBIC ACID) 500 MG tablet Take 500 mg by mouth daily.   Yes Historical Provider, MD  nitrofurantoin, macrocrystal-monohydrate, (MACROBID) 100 MG capsule Take 1 capsule (100 mg total) by mouth 2 (two) times daily. Take with food. 08/06/16   Kandra Nicolas, MD  phenazopyridine (PYRIDIUM) 200 MG tablet Take 1 tablet (200 mg total) by mouth 3 (three) times daily. Take after meals. 08/06/16   Kandra Nicolas, MD    Family History Family History  Problem Relation Age of Onset  . Heart disease Maternal Grandmother     MI  . Stroke Maternal Grandmother     x2  . Heart disease Maternal Grandfather     MI  . Heart disease Paternal Grandmother     MI  . Heart disease Paternal Grandfather     MI  . Diabetes Father   . Narcolepsy Mother   . Liver disease Brother     fatty liver?  . Liver disease Sister     from birth control    Social History Social History  Substance Use Topics  . Smoking status: Never Smoker  . Smokeless tobacco: Never Used  . Alcohol use Yes     Comment: rarely     Allergies   Patient has no known allergies.   Review of Systems Review of Systems  Constitutional:  Negative for fever.  Gastrointestinal: Negative for abdominal pain, nausea and vomiting.  Genitourinary: Negative for flank pain and vaginal discharge.  All other systems reviewed and are negative.    Physical Exam Triage Vital Signs ED Triage Vitals  Enc Vitals Group     BP 08/06/16 1550 104/66     Pulse Rate 08/06/16 1550 61     Resp --      Temp 08/06/16 1550 97.9 F (36.6 C)     Temp Source 08/06/16 1550 Oral     SpO2 08/06/16 1550 99 %     Weight 08/06/16 1551 125 lb (56.7 kg)     Height --      Head Circumference --      Peak Flow --      Pain Score 08/06/16 1553 0     Pain Loc --      Pain Edu? --      Excl. in [place]? --    No data found.   Updated Vital Signs BP 104/66 (BP Location: Left Arm)    Pulse 61   Temp 97.9 F (36.6 C) (Oral)   Wt 125 lb (56.7 kg)   SpO2 99%   BMI 22.14 kg/m   Visual Acuity Right Eye Distance:   Left Eye Distance:   Bilateral Distance:    Right Eye Near:   Left Eye Near:    Bilateral Near:     Physical Exam Nursing notes and Vital Signs reviewed. Appearance:  Patient appears stated age, and in no acute distress.    Eyes:  Pupils are equal, round, and reactive to light and accomodation.  Extraocular movement is intact.  Conjunctivae are not inflamed   Pharynx:  Normal; moist mucous membranes  Neck:  Supple.  No adenopathy Lungs:  Clear to auscultation.  Breath sounds are equal.  Moving air well. Heart:  Regular rate and rhythm without murmurs, rubs, or gallops.  Abdomen:  Nontender without masses or hepatosplenomegaly.  Bowel sounds are present.  No CVA or flank tenderness.  Extremities:  No edema.  Skin:  No rash present.     UC Treatments / Results  Labs (all labs ordered are listed, but only abnormal results are displayed) Labs Reviewed  POCT URINALYSIS DIP (MANUAL ENTRY) - Abnormal; Notable for the following:       Result Value   Blood, UA moderate (*)    Leukocytes, UA moderate (2+) (*)    All other components within normal limits  URINE CULTURE    EKG  EKG Interpretation None       Radiology No results found.  Procedures Procedures (including critical care time)  Medications Ordered in UC Medications - No data to display   Initial Impression / Assessment and Plan / UC Course  I have reviewed the triage vital signs and the nursing notes.  Pertinent labs & imaging results that were available during my care of the patient were reviewed by me and considered in my medical decision making (see chart for details).    Urine culture pending. Begin Macrobid 100mg  BID for one week. Rx for Pyridium. Increase fluid intake. If symptoms become significantly worse during the night or over the weekend, proceed to the local  emergency room.  Followup with Family Doctor if not improved in one week.     Final Clinical Impressions(s) / UC Diagnoses   Final diagnoses:  Dysuria  Acute cystitis without hematuria    New Prescriptions New Prescriptions   NITROFURANTOIN, MACROCRYSTAL-MONOHYDRATE, (MACROBID)  100 MG CAPSULE    Take 1 capsule (100 mg total) by mouth 2 (two) times daily. Take with food.   PHENAZOPYRIDINE (PYRIDIUM) 200 MG TABLET    Take 1 tablet (200 mg total) by mouth 3 (three) times daily. Take after meals.     Kandra Nicolas, MD 08/06/16 [phone]

## 2016-08-06 NOTE — Discharge Instructions (Signed)
Increase fluid intake. °If symptoms become significantly worse during the night or over the weekend, proceed to the local emergency room.  °

## 2016-08-06 NOTE — ED Triage Notes (Signed)
Patient c/o dysuria, polyuria x yesterday AM. Afebrile. No otc meds taken.

## 2016-08-08 ENCOUNTER — Telehealth: Payer: Self-pay

## 2016-08-08 LAB — URINE CULTURE: Organism ID, Bacteria: NO GROWTH

## 2016-08-08 NOTE — Telephone Encounter (Signed)
Left message to call for results

## 2016-08-08 NOTE — Telephone Encounter (Signed)
Pt called back for results.  Given.  Pt feeling much better.

## 2016-11-02 ENCOUNTER — Other Ambulatory Visit: Payer: Self-pay | Admitting: Obstetrics and Gynecology

## 2016-11-02 DIAGNOSIS — Z1231 Encounter for screening mammogram for malignant neoplasm of breast: Secondary | ICD-10-CM

## 2017-02-08 ENCOUNTER — Ambulatory Visit: Payer: PRIVATE HEALTH INSURANCE

## 2017-02-15 ENCOUNTER — Ambulatory Visit
Admission: RE | Admit: 2017-02-15 | Discharge: 2017-02-15 | Disposition: A | Discharge status: Home / Self Care | Payer: PRIVATE HEALTH INSURANCE | Source: Ambulatory Visit | Attending: Obstetrics and Gynecology | Admitting: Obstetrics and Gynecology

## 2017-02-15 DIAGNOSIS — Z1231 Encounter for screening mammogram for malignant neoplasm of breast: Secondary | ICD-10-CM

## 2018-01-20 ENCOUNTER — Other Ambulatory Visit: Payer: Self-pay | Admitting: Obstetrics and Gynecology

## 2018-01-20 DIAGNOSIS — Z1231 Encounter for screening mammogram for malignant neoplasm of breast: Secondary | ICD-10-CM

## 2018-01-30 ENCOUNTER — Other Ambulatory Visit: Payer: Self-pay | Admitting: Obstetrics and Gynecology

## 2018-01-30 DIAGNOSIS — Z780 Asymptomatic menopausal state: Secondary | ICD-10-CM

## 2018-02-19 ENCOUNTER — Ambulatory Visit
Admission: RE | Admit: 2018-02-19 | Discharge: 2018-02-19 | Disposition: A | Discharge status: Home / Self Care | Payer: PRIVATE HEALTH INSURANCE | Source: Ambulatory Visit

## 2018-02-19 DIAGNOSIS — Z1231 Encounter for screening mammogram for malignant neoplasm of breast: Secondary | ICD-10-CM

## 2018-04-01 ENCOUNTER — Ambulatory Visit
Admission: RE | Admit: 2018-04-01 | Discharge: 2018-04-01 | Disposition: A | Discharge status: Home / Self Care | Payer: PRIVATE HEALTH INSURANCE | Source: Ambulatory Visit | Attending: Obstetrics and Gynecology | Admitting: Obstetrics and Gynecology

## 2018-04-01 DIAGNOSIS — Z780 Asymptomatic menopausal state: Secondary | ICD-10-CM

## 2019-04-04 ENCOUNTER — Encounter (HOSPITAL_COMMUNITY): Payer: Self-pay | Admitting: Emergency Medicine

## 2019-04-04 ENCOUNTER — Emergency Department (HOSPITAL_COMMUNITY)
Admission: EM | Admit: 2019-04-04 | Discharge: 2019-04-04 | Disposition: A | Discharge status: Home / Self Care | Payer: PRIVATE HEALTH INSURANCE | Attending: Emergency Medicine | Admitting: Emergency Medicine

## 2019-04-04 ENCOUNTER — Emergency Department (HOSPITAL_COMMUNITY): Payer: PRIVATE HEALTH INSURANCE

## 2019-04-04 ENCOUNTER — Other Ambulatory Visit: Payer: Self-pay

## 2019-04-04 DIAGNOSIS — Y999 Unspecified external cause status: Secondary | ICD-10-CM | POA: Insufficient documentation

## 2019-04-04 DIAGNOSIS — W010XXA Fall on same level from slipping, tripping and stumbling without subsequent striking against object, initial encounter: Secondary | ICD-10-CM | POA: Diagnosis not present

## 2019-04-04 DIAGNOSIS — Y92821 Forest as the place of occurrence of the external cause: Secondary | ICD-10-CM | POA: Diagnosis not present

## 2019-04-04 DIAGNOSIS — Z79899 Other long term (current) drug therapy: Secondary | ICD-10-CM | POA: Insufficient documentation

## 2019-04-04 DIAGNOSIS — Z86718 Personal history of other venous thrombosis and embolism: Secondary | ICD-10-CM | POA: Diagnosis not present

## 2019-04-04 DIAGNOSIS — S99912A Unspecified injury of left ankle, initial encounter: Secondary | ICD-10-CM | POA: Diagnosis present

## 2019-04-04 DIAGNOSIS — Y9301 Activity, walking, marching and hiking: Secondary | ICD-10-CM | POA: Diagnosis not present

## 2019-04-04 DIAGNOSIS — S82832A Other fracture of upper and lower end of left fibula, initial encounter for closed fracture: Secondary | ICD-10-CM | POA: Diagnosis not present

## 2019-04-04 MED ORDER — HYDROCODONE-ACETAMINOPHEN 5-325 MG PO TABS: 1.0000 | ORAL_TABLET | Freq: Four times a day (QID) | ORAL | 0 refills | Status: DC | PRN

## 2019-04-04 NOTE — ED Triage Notes (Signed)
Pt tripped on a root while hiking around 11am.  States she heard and felt L ankle pop.  Swelling noted to L ankle.

## 2019-04-04 NOTE — ED Provider Notes (Signed)
[facility] EMERGENCY DEPARTMENT Provider Note   CSN: [id number] Arrival date & time: 04/04/19  1427     History   Chief Complaint Chief Complaint  Patient presents with  . Ankle Pain    HPI Miranda Cabrera is a 53 y.o. female presenting for evaluation of left ankle pain.  Patient states around 11:00 this morning she was walking in the woods when she stepped on a root and sharply inverted her L ankle.  He reports acute onset pain.  She heard something pop.  She has been unable to weight-bear since then.  Patient reports pain is mild at rest, severe pain with movement, palpation, or weightbearing.  She denies injury elsewhere.  She denies hitting her head or loss of consciousness.  She states she was given 4 Aleve, has not taken anything else.  She is not on blood thinners. Does not have an orthopedic doctor.      HPI  Past Medical History:  Diagnosis Date  . DVT (deep vein thrombosis) in pregnancy 2000   was on injectible heparin  . History of shingles    near right eye  . MVP (mitral valve prolapse)     Patient Active Problem List   Diagnosis Date Noted  . Palpitations 07/04/2012  . PVC (premature ventricular contraction) 05/20/2012  . Fatigue 05/20/2012  . Shortness of breath 05/20/2012  . MVP (mitral valve prolapse) 04/18/2012  . VARICOSE VEINS, LOWER EXTREMITIES 09/02/2009  . DEEP VENOUS THROMBOPHLEBITIS, HX OF 09/02/2009    Past Surgical History:  Procedure Laterality Date  . vein removal  2000     OB History   No obstetric history on file.      Home Medications    Prior to Admission medications   Medication Sig Start Date End Date Taking? Authorizing Provider  HYDROcodone-acetaminophen (NORCO/VICODIN) 5-325 MG tablet Take 1 tablet by mouth every 6 (six) hours as needed. 04/04/19   Sharde Gover, PA-C  loratadine (CLARITIN) 10 MG tablet Take 10 mg by mouth daily.    [provider]  metoprolol succinate (TOPROL-XL) 25 MG  24 hr tablet Take 25 mg by mouth daily.    [provider]  nitrofurantoin, macrocrystal-monohydrate, (MACROBID) 100 MG capsule Take 1 capsule (100 mg total) by mouth 2 (two) times daily. Take with food. 08/06/16   Kandra Nicolas, MD  phenazopyridine (PYRIDIUM) 200 MG tablet Take 1 tablet (200 mg total) by mouth 3 (three) times daily. Take after meals. 08/06/16   Kandra Nicolas, MD  vitamin C (ASCORBIC ACID) 500 MG tablet Take 500 mg by mouth daily.    [provider]    Family History Family History  Problem Relation Age of Onset  . Heart disease Maternal Grandmother        MI  . Stroke Maternal Grandmother        x2  . Heart disease Maternal Grandfather        MI  . Heart disease Paternal Grandmother        MI  . Heart disease Paternal Grandfather        MI  . Diabetes Father   . Narcolepsy Mother   . Liver disease Brother        fatty liver?  . Liver disease Sister        from birth control    Social History Social History   Tobacco Use  . Smoking status: Never Smoker  . Smokeless tobacco: Never Used  Substance Use Topics  .  Alcohol use: Yes    Comment: rarely  . Drug use: No     Allergies   Patient has no known allergies.   Review of Systems Review of Systems  Musculoskeletal: Positive for arthralgias and joint swelling.  Neurological: Negative for numbness.  Hematological: Does not bruise/bleed easily.     Physical Exam Updated Vital Signs BP 106/70 (BP Location: Right Arm)   Pulse 80   Temp 98.5 F (36.9 C) (Oral)   Resp 18   Ht 5\' 3"  (1.6 m)   SpO2 99%   BMI 22.14 kg/m   Physical Exam Vitals signs and nursing note reviewed.  Constitutional:      General: She is not in acute distress.    Appearance: She is well-developed.     Comments: Sitting up in the bed in no acute distress  HENT:     Head: Normocephalic and atraumatic.  Neck:     Musculoskeletal: Normal range of motion.  Pulmonary:     Effort: Pulmonary effort  is normal.  Abdominal:     General: There is no distension.  Musculoskeletal:        General: Swelling and tenderness present.     Comments: Swelling of the lateral left ankle.  Tenderness palpation over the lateral malleolus.  Pedal pulses intact.  No tenderness palpation of the medial malleolus.  Good distal cap refill.  No tenderness palpation of the calf or distal lower leg.  Skin:    General: Skin is warm.     Capillary Refill: Capillary refill takes less than 2 seconds.     Findings: No rash.  Neurological:     Mental Status: She is alert and oriented to person, place, and time.      ED Treatments / Results  Labs (all labs ordered are listed, but only abnormal results are displayed) Labs Reviewed - No data to display  EKG None  Radiology Dg Ankle Complete Left  Result Date: 04/04/2019 CLINICAL DATA:  Fall, left ankle pain EXAM: LEFT ANKLE COMPLETE - 3+ VIEW COMPARISON:  None. FINDINGS: There is a minimally displaced transverse fracture of the left fibular tip. The distal tibia is intact. Joint spaces are well preserved. Soft tissue edema about the lateral ankle. IMPRESSION: There is a minimally displaced transverse fracture of the left fibular tip. The distal tibia is intact. Joint spaces are well preserved. Soft tissue edema about the lateral ankle. Electronically Signed   By: Eddie Candle M.D.   On: 04/04/2019 15:49    Procedures Procedures (including critical care time)  Medications Ordered in ED Medications - No data to display   Initial Impression / Assessment and Plan / ED Course  I have reviewed the triage vital signs and the nursing notes.  Pertinent labs & imaging results that were available during my care of the patient were reviewed by me and considered in my medical decision making (see chart for details).        Patient presented for evaluation of left ankle pain and swelling.  Physical exam shows patient who is neurovascularly intact.  As she had a  pop and has persistent swelling and is unable to weight-bear, will obtain x-rays for further evaluation.  X-rays viewed interpreted by me, shows distal fibular fracture.  Mortise is intact.  Discussed findings with patient.  Discussed treatment with Cam walker, crutches as needed for pain.  Discussed use of Tylenol, ibuprofen, and Norco as needed.  Follow-up with orthopedics.  At this time, patient appears safe  for discharge.  Return precautions given.  Patient states she understands and agrees to plan.  Final Clinical Impressions(s) / ED Diagnoses   Final diagnoses:  Closed avulsion fracture of distal end of left fibula, initial encounter    ED Discharge Orders         Ordered    HYDROcodone-acetaminophen (NORCO/VICODIN) 5-325 MG tablet  Every 6 hours PRN     04/04/19 1604           Inis Borneman, PA-C 04/04/19 1812    Blanchie Dessert, MD 04/05/19 2312

## 2019-04-04 NOTE — Discharge Instructions (Signed)
Keep your foot elevated and rested for the next several days. Use ice to help with pain and swelling. Use Tylenol and ibuprofen as needed for mild to moderate pain.  Use Norco as needed for severe or breakthrough pain.  Have caution, this is a narcotic medicine.  Do not drive or operate heavy machinery while taking this medicine. Follow-up with the orthopedic doctor listed below for further evaluation management of your ankle. Return to the emergency room with any new, worsening, concerning symptoms.

## 2019-08-07 DIAGNOSIS — H524 Presbyopia: Secondary | ICD-10-CM | POA: Diagnosis not present

## 2019-08-13 MED FILL — METOPROLOL SUCCINATE ER 25: 25 | 90 days supply | Qty: 90 | Fill #0

## 2019-08-28 ENCOUNTER — Other Ambulatory Visit: Payer: Self-pay | Admitting: Obstetrics and Gynecology

## 2019-08-28 DIAGNOSIS — Z1231 Encounter for screening mammogram for malignant neoplasm of breast: Secondary | ICD-10-CM

## 2019-09-04 ENCOUNTER — Other Ambulatory Visit: Payer: Self-pay

## 2019-09-04 ENCOUNTER — Ambulatory Visit
Admission: RE | Admit: 2019-09-04 | Discharge: 2019-09-04 | Disposition: A | Discharge status: Home / Self Care | Payer: 59 | Source: Ambulatory Visit

## 2019-09-04 DIAGNOSIS — Z1231 Encounter for screening mammogram for malignant neoplasm of breast: Secondary | ICD-10-CM | POA: Diagnosis not present

## 2019-11-03 MED FILL — METOPROLOL SUCCINATE ER 25: 25 | 90 days supply | Qty: 90 | Fill #1

## 2019-11-23 MED FILL — VALACYCLOVIR HCL 500 MG TAB: 500 | 3 days supply | Qty: 6 | Fill #0

## 2019-12-10 DIAGNOSIS — G4709 Other insomnia: Secondary | ICD-10-CM | POA: Diagnosis not present

## 2019-12-10 DIAGNOSIS — Z6826 Body mass index (BMI) 26.0-26.9, adult: Secondary | ICD-10-CM | POA: Diagnosis not present

## 2019-12-10 DIAGNOSIS — Z01419 Encounter for gynecological examination (general) (routine) without abnormal findings: Secondary | ICD-10-CM | POA: Diagnosis not present

## 2019-12-11 MED FILL — ZOLPIDEM TARTRATE 10 MG TAB: 10 | 30 days supply | Qty: 30 | Fill #0

## 2020-02-03 MED FILL — METOPROLOL SUCCINATE ER 25: 25 | 90 days supply | Qty: 90 | Fill #0

## 2020-02-03 MED FILL — VALACYCLOVIR HCL 500 MG TAB: 500 | 3 days supply | Qty: 6 | Fill #1

## 2020-04-07 ENCOUNTER — Other Ambulatory Visit (HOSPITAL_COMMUNITY): Payer: Self-pay | Admitting: Obstetrics and Gynecology

## 2020-04-07 DIAGNOSIS — Z6826 Body mass index (BMI) 26.0-26.9, adult: Secondary | ICD-10-CM | POA: Diagnosis not present

## 2020-04-07 DIAGNOSIS — E669 Obesity, unspecified: Secondary | ICD-10-CM | POA: Diagnosis not present

## 2020-04-07 MED FILL — PHENTERMINE 37.5 MG TABLET: 37.5 | 30 days supply | Qty: 30 | Fill #0

## 2020-04-08 MED FILL — ZOLPIDEM TARTRATE 10 MG TAB: 10 | 30 days supply | Qty: 30 | Fill #1

## 2020-04-22 DIAGNOSIS — B009 Herpesviral infection, unspecified: Secondary | ICD-10-CM | POA: Diagnosis not present

## 2020-04-22 DIAGNOSIS — Z13220 Encounter for screening for lipoid disorders: Secondary | ICD-10-CM | POA: Diagnosis not present

## 2020-04-22 DIAGNOSIS — I493 Ventricular premature depolarization: Secondary | ICD-10-CM | POA: Diagnosis not present

## 2020-04-22 DIAGNOSIS — G479 Sleep disorder, unspecified: Secondary | ICD-10-CM | POA: Diagnosis not present

## 2020-04-22 DIAGNOSIS — Z0000 Encounter for general adult medical examination without abnormal findings: Secondary | ICD-10-CM | POA: Diagnosis not present

## 2020-05-09 ENCOUNTER — Other Ambulatory Visit (HOSPITAL_COMMUNITY): Payer: Self-pay | Admitting: Family Medicine

## 2020-05-09 DIAGNOSIS — E663 Overweight: Secondary | ICD-10-CM | POA: Diagnosis not present

## 2020-05-09 DIAGNOSIS — Z713 Dietary counseling and surveillance: Secondary | ICD-10-CM | POA: Diagnosis not present

## 2020-05-09 MED FILL — METOPROLOL SUCCINATE ER 25: 25 | 90 days supply | Qty: 90 | Fill #0

## 2020-05-10 MED FILL — PHENTERMINE 37.5 MG TABLET: 37.5 | 30 days supply | Qty: 30 | Fill #0

## 2020-05-13 ENCOUNTER — Ambulatory Visit: Payer: PRIVATE HEALTH INSURANCE

## 2020-05-13 MED FILL — VALACYCLOVIR HCL 500 MG TAB: 500 | 3 days supply | Qty: 6 | Fill #2

## 2020-07-06 MED FILL — PHENTERMINE 37.5 MG TABLET: 37.5 | 30 days supply | Qty: 30 | Fill #1

## 2020-07-20 MED FILL — PHENTERMINE 37.5 MG TABLET: 37.5 | 30 days supply | Qty: 30 | Fill #1

## 2020-07-27 DIAGNOSIS — E663 Overweight: Secondary | ICD-10-CM | POA: Diagnosis not present

## 2020-08-15 MED FILL — METOPROLOL SUCCINATE ER 25: 25 | 90 days supply | Qty: 90 | Fill #1

## 2020-10-25 DIAGNOSIS — Z23 Encounter for immunization: Secondary | ICD-10-CM | POA: Diagnosis not present

## 2020-11-09 ENCOUNTER — Other Ambulatory Visit (HOSPITAL_COMMUNITY): Payer: Self-pay

## 2020-11-09 MED ORDER — VALACYCLOVIR HCL 500 MG PO TABS
ORAL_TABLET | ORAL | 6 refills | Status: AC
  Filled 2020-11-09: qty 30, 15d supply, fill #0

## 2020-11-18 ENCOUNTER — Other Ambulatory Visit (HOSPITAL_COMMUNITY): Payer: Self-pay

## 2020-11-18 ENCOUNTER — Other Ambulatory Visit (HOSPITAL_BASED_OUTPATIENT_CLINIC_OR_DEPARTMENT_OTHER): Payer: Self-pay

## 2020-11-18 MED FILL — Metoprolol Succinate Tab ER 24HR 25 MG (Tartrate Equiv): ORAL | 90 days supply | Qty: 90 | Fill #0 | Status: AC

## 2020-11-30 ENCOUNTER — Other Ambulatory Visit: Payer: Self-pay | Admitting: Family Medicine

## 2020-11-30 DIAGNOSIS — Z1231 Encounter for screening mammogram for malignant neoplasm of breast: Secondary | ICD-10-CM

## 2020-12-23 DIAGNOSIS — H524 Presbyopia: Secondary | ICD-10-CM | POA: Diagnosis not present

## 2021-01-16 ENCOUNTER — Ambulatory Visit
Admission: RE | Admit: 2021-01-16 | Discharge: 2021-01-16 | Disposition: A | Discharge status: Home / Self Care | Payer: PRIVATE HEALTH INSURANCE | Source: Ambulatory Visit

## 2021-01-16 ENCOUNTER — Other Ambulatory Visit: Payer: Self-pay

## 2021-01-16 DIAGNOSIS — Z1231 Encounter for screening mammogram for malignant neoplasm of breast: Secondary | ICD-10-CM | POA: Diagnosis not present

## 2021-01-23 DIAGNOSIS — Z1211 Encounter for screening for malignant neoplasm of colon: Secondary | ICD-10-CM | POA: Diagnosis not present

## 2021-01-23 DIAGNOSIS — Z01419 Encounter for gynecological examination (general) (routine) without abnormal findings: Secondary | ICD-10-CM | POA: Diagnosis not present

## 2021-02-10 ENCOUNTER — Other Ambulatory Visit (HOSPITAL_COMMUNITY): Payer: Self-pay

## 2021-02-10 MED ORDER — METOPROLOL SUCCINATE ER 25 MG PO TB24
25.0000 mg | ORAL_TABLET | Freq: Every day | ORAL | 3 refills | Status: DC
  Filled 2021-02-10: qty 90, 90d supply, fill #0
  Filled 2021-05-13 – 2021-05-26 (×2): qty 90, 90d supply, fill #1
  Filled 2021-08-21: qty 90, 90d supply, fill #2
  Filled 2021-11-21: qty 90, 90d supply, fill #3

## 2021-02-14 ENCOUNTER — Other Ambulatory Visit (HOSPITAL_COMMUNITY): Payer: Self-pay

## 2021-03-28 ENCOUNTER — Encounter: Payer: Self-pay | Admitting: Gastroenterology

## 2021-05-08 DIAGNOSIS — B029 Zoster without complications: Secondary | ICD-10-CM | POA: Insufficient documentation

## 2021-05-08 DIAGNOSIS — B009 Herpesviral infection, unspecified: Secondary | ICD-10-CM | POA: Insufficient documentation

## 2021-05-12 DIAGNOSIS — Z13220 Encounter for screening for lipoid disorders: Secondary | ICD-10-CM | POA: Diagnosis not present

## 2021-05-12 DIAGNOSIS — B009 Herpesviral infection, unspecified: Secondary | ICD-10-CM | POA: Diagnosis not present

## 2021-05-12 DIAGNOSIS — I493 Ventricular premature depolarization: Secondary | ICD-10-CM | POA: Diagnosis not present

## 2021-05-12 DIAGNOSIS — Z8249 Family history of ischemic heart disease and other diseases of the circulatory system: Secondary | ICD-10-CM | POA: Diagnosis not present

## 2021-05-12 DIAGNOSIS — Z0000 Encounter for general adult medical examination without abnormal findings: Secondary | ICD-10-CM | POA: Diagnosis not present

## 2021-05-12 DIAGNOSIS — Z23 Encounter for immunization: Secondary | ICD-10-CM | POA: Diagnosis not present

## 2021-05-12 DIAGNOSIS — G479 Sleep disorder, unspecified: Secondary | ICD-10-CM | POA: Diagnosis not present

## 2021-05-13 ENCOUNTER — Other Ambulatory Visit (HOSPITAL_COMMUNITY): Payer: Self-pay

## 2021-05-15 ENCOUNTER — Other Ambulatory Visit: Payer: Self-pay | Admitting: Family Medicine

## 2021-05-15 DIAGNOSIS — Z8249 Family history of ischemic heart disease and other diseases of the circulatory system: Secondary | ICD-10-CM

## 2021-05-22 ENCOUNTER — Other Ambulatory Visit (HOSPITAL_COMMUNITY): Payer: Self-pay

## 2021-05-26 ENCOUNTER — Encounter: Payer: 59 | Admitting: Gastroenterology

## 2021-05-26 ENCOUNTER — Other Ambulatory Visit (HOSPITAL_COMMUNITY): Payer: Self-pay

## 2021-05-30 ENCOUNTER — Encounter: Payer: 59 | Admitting: Gastroenterology

## 2021-06-14 ENCOUNTER — Other Ambulatory Visit: Payer: 59

## 2021-06-20 ENCOUNTER — Ambulatory Visit
Admission: RE | Admit: 2021-06-20 | Discharge: 2021-06-20 | Disposition: A | Discharge status: Home / Self Care | Payer: 59 | Source: Ambulatory Visit | Attending: Family Medicine | Admitting: Family Medicine

## 2021-06-20 ENCOUNTER — Other Ambulatory Visit: Payer: Self-pay

## 2021-06-20 DIAGNOSIS — Z8249 Family history of ischemic heart disease and other diseases of the circulatory system: Secondary | ICD-10-CM

## 2021-06-27 ENCOUNTER — Ambulatory Visit (AMBULATORY_SURGERY_CENTER): Payer: 59

## 2021-06-27 ENCOUNTER — Other Ambulatory Visit (HOSPITAL_COMMUNITY): Payer: Self-pay

## 2021-06-27 VITALS — Ht 63.0 in | Wt 148.0 lb

## 2021-06-27 DIAGNOSIS — Z1211 Encounter for screening for malignant neoplasm of colon: Secondary | ICD-10-CM

## 2021-06-27 MED ORDER — NA SULFATE-K SULFATE-MG SULF 17.5-3.13-1.6 GM/177ML PO SOLN
1.0000 | Freq: Once | ORAL | 0 refills | Status: AC
  Filled 2021-06-27: qty 354, 1d supply, fill #0

## 2021-06-27 NOTE — Progress Notes (Signed)
No egg or soy allergy known to patient  No issues known to pt with past sedation with any surgeries or procedures Patient denies ever being told they had issues or difficulty with intubation  No FH of Malignant Hyperthermia Pt is not on diet pills Pt is not on  home 02  Pt is not on blood thinners  Pt denies issues with constipation  No A fib or A flutter   NO PA's for preps discussed with pt In PV today  Discussed with pt there will be an out-of-pocket cost for prep and that varies from $0 to 70 +  dollars - pt verbalized understanding   Due to the COVID-19 pandemic we are asking patients to follow certain guidelines in PV and the [place]   Pt aware of COVID protocols and [facility] guidelines   PV completed over the phone. Pt verified name, DOB, address and insurance during PV today.  Pt mailed instruction packet with copy of consent form to read and not return, and instructions.  Pt encouraged to call with questions or issues.   procedure instructions sent via My Chart

## 2021-06-28 ENCOUNTER — Other Ambulatory Visit (HOSPITAL_COMMUNITY): Payer: Self-pay

## 2021-07-04 ENCOUNTER — Encounter: Payer: Self-pay | Admitting: Gastroenterology

## 2021-07-11 ENCOUNTER — Ambulatory Visit (AMBULATORY_SURGERY_CENTER): Payer: 59 | Admitting: Gastroenterology

## 2021-07-11 ENCOUNTER — Encounter: Payer: Self-pay | Admitting: Gastroenterology

## 2021-07-11 VITALS — BP 110/59 | HR 71 | Temp 97.5°F | Resp 16 | Ht 63.0 in | Wt 143.8 lb

## 2021-07-11 DIAGNOSIS — K635 Polyp of colon: Secondary | ICD-10-CM | POA: Diagnosis not present

## 2021-07-11 DIAGNOSIS — D124 Benign neoplasm of descending colon: Secondary | ICD-10-CM

## 2021-07-11 DIAGNOSIS — D125 Benign neoplasm of sigmoid colon: Secondary | ICD-10-CM

## 2021-07-11 DIAGNOSIS — D120 Benign neoplasm of cecum: Secondary | ICD-10-CM | POA: Diagnosis not present

## 2021-07-11 DIAGNOSIS — Z1211 Encounter for screening for malignant neoplasm of colon: Secondary | ICD-10-CM | POA: Diagnosis not present

## 2021-07-11 MED ORDER — SODIUM CHLORIDE 0.9 % IV SOLN: 500.0000 mL | INTRAVENOUS | Status: DC

## 2021-07-11 NOTE — Patient Instructions (Signed)
Resume previous medications.  5 polyps removed and sent to pathology. Await results for final recommendations.  Handouts on findings given to patient ( polyps and diverticulosis)  YOU HAD AN ENDOSCOPIC PROCEDURE TODAY AT [facility]:   Refer to the procedure report that was given to you for any specific questions about what was found during the examination.  If the procedure report does not answer your questions, please call your gastroenterologist to clarify.  If you requested that your care partner not be given the details of your procedure findings, then the procedure report has been included in a sealed envelope for you to review at your convenience later.  YOU SHOULD EXPECT: Some feelings of bloating in the abdomen. Passage of more gas than usual.  Walking can help get rid of the air that was put into your GI tract during the procedure and reduce the bloating. If you had a lower endoscopy (such as a colonoscopy or flexible sigmoidoscopy) you may notice spotting of blood in your stool or on the toilet paper. If you underwent a bowel prep for your procedure, you may not have a normal bowel movement for a few days.  Please Note:  You might notice some irritation and congestion in your nose or some drainage.  This is from the oxygen used during your procedure.  There is no need for concern and it should clear up in a day or so.  SYMPTOMS TO REPORT IMMEDIATELY:  Following lower endoscopy (colonoscopy or flexible sigmoidoscopy):  Excessive amounts of blood in the stool  Significant tenderness or worsening of abdominal pains  Swelling of the abdomen that is new, acute  Fever of 100F or higher   For urgent or emergent issues, a gastroenterologist can be reached at any hour by calling [phone]. Do not use MyChart messaging for urgent concerns.    DIET:  We do recommend a small meal at first, but then you may proceed to your regular diet.  Drink plenty of fluids but you should  avoid alcoholic beverages for 24 hours.  ACTIVITY:  You should plan to take it easy for the rest of today and you should NOT DRIVE or use heavy machinery until tomorrow (because of the sedation medicines used during the test).    FOLLOW UP: Our staff will call the number listed on your records 48-72 hours following your procedure to check on you and address any questions or concerns that you may have regarding the information given to you following your procedure. If we do not reach you, we will leave a message.  We will attempt to reach you two times.  During this call, we will ask if you have developed any symptoms of COVID 19. If you develop any symptoms (ie: fever, flu-like symptoms, shortness of breath, cough etc.) before then, please call [phone].  If you test positive for Covid 19 in the 2 weeks post procedure, please call and report this information to [place].    If any biopsies were taken you will be contacted by phone or by letter within the next 1-3 weeks.  Please call us at [phone] if you have not heard about the biopsies in 3 weeks.    SIGNATURES/CONFIDENTIALITY: You and/or your care partner have signed paperwork which will be entered into your electronic medical record.  These signatures attest to the fact that that the information above on your After Visit Summary has been reviewed and is understood.  Full responsibility of the confidentiality of  this discharge information lies with you and/or your care-partner.

## 2021-07-11 NOTE — Progress Notes (Signed)
[facility] History and Physical   Primary Care Physician:  Kelton Pillar, MD   Reason for Procedure:   Colon cancer screening  Plan:    Screening colonoscopy     HPI: Miranda Cabrera is a 55 y.o. female undergoing initial average risk screening colonoscopy.  She has no family history of colon cancer and no chronic GI symptoms.    Past Medical History:  Diagnosis Date   Allergy    seasonal   DVT (deep vein thrombosis) in pregnancy 06/11/1998   was on injectible heparin   History of shingles    near right eye   MVP (mitral valve prolapse)     Past Surgical History:  Procedure Laterality Date   vein removal  2000    Prior to Admission medications   Medication Sig Start Date End Date Taking? Authorizing Provider  Calcium Carb-Cholecalciferol 500-10 MG-MCG TABS Take by mouth.   Yes [provider]  fluticasone (FLONASE) 50 MCG/ACT nasal spray 1 spray in each nostril   Yes [provider]  loratadine (CLARITIN) 10 MG tablet Take 10 mg by mouth daily.   Yes [provider]  metoprolol succinate (TOPROL-XL) 25 MG 24 hr tablet 1 tablet   Yes [provider]  Multiple Vitamins-Minerals (WOMENS MULTI PO) Take by mouth.   Yes [provider]  vitamin C (ASCORBIC ACID) 500 MG tablet Take 500 mg by mouth daily.   Yes [provider]  HYDROcodone-acetaminophen (NORCO/VICODIN) 5-325 MG tablet Take 1 tablet by mouth every 6 (six) hours as needed. 04/04/19   Caccavale, Sophia, PA-C  metoprolol succinate (TOPROL-XL) 25 MG 24 hr tablet Take 25 mg by mouth daily.    [provider]  metoprolol succinate (TOPROL-XL) 25 MG 24 hr tablet TAKE 1 TABLET BY MOUTH ONCE A DAY 05/09/20 05/09/21  Kelton Pillar, MD  metoprolol succinate (TOPROL-XL) 25 MG 24 hr tablet Take 1 tablet (25 mg total) by mouth daily. 02/10/21     nitrofurantoin, macrocrystal-monohydrate, (MACROBID) 100 MG capsule Take 1 capsule (100 mg total) by mouth  2 (two) times daily. Take with food. 08/06/16   Kandra Nicolas, MD  PFIZER COVID-19 VAC BIVALENT injection  04/09/21   [provider]  phenazopyridine (PYRIDIUM) 200 MG tablet Take 1 tablet (200 mg total) by mouth 3 (three) times daily. Take after meals. 08/06/16   Kandra Nicolas, MD  phentermine (ADIPEX-P) 37.5 MG tablet TAKE 1 TABLET BY MOUTH EVERY MORNING 05/09/20 11/05/20  Louretta Shorten, MD  phentermine (ADIPEX-P) 37.5 MG tablet TAKE 1 TABLET BY MOUTH EVERY MORNING 04/07/20 10/04/20  Louretta Shorten, MD  valACYclovir (VALTREX) 500 MG tablet Take 1 tablet by mouth twice a day for 3 days as needed for outbreak 11/09/20       Current Outpatient Medications  Medication Sig Dispense Refill   Calcium Carb-Cholecalciferol 500-10 MG-MCG TABS Take by mouth.     fluticasone (FLONASE) 50 MCG/ACT nasal spray 1 spray in each nostril     loratadine (CLARITIN) 10 MG tablet Take 10 mg by mouth daily.     metoprolol succinate (TOPROL-XL) 25 MG 24 hr tablet 1 tablet     Multiple Vitamins-Minerals (WOMENS MULTI PO) Take by mouth.     vitamin C (ASCORBIC ACID) 500 MG tablet Take 500 mg by mouth daily.     HYDROcodone-acetaminophen (NORCO/VICODIN) 5-325 MG tablet Take 1 tablet by mouth every 6 (six) hours as needed. 6 tablet 0   metoprolol succinate (TOPROL-XL) 25 MG 24 hr tablet  Take 25 mg by mouth daily.     metoprolol succinate (TOPROL-XL) 25 MG 24 hr tablet TAKE 1 TABLET BY MOUTH ONCE A DAY 90 tablet 2   metoprolol succinate (TOPROL-XL) 25 MG 24 hr tablet Take 1 tablet (25 mg total) by mouth daily. 90 tablet 3   nitrofurantoin, macrocrystal-monohydrate, (MACROBID) 100 MG capsule Take 1 capsule (100 mg total) by mouth 2 (two) times daily. Take with food. 14 capsule 0   PFIZER COVID-19 VAC BIVALENT injection      phenazopyridine (PYRIDIUM) 200 MG tablet Take 1 tablet (200 mg total) by mouth 3 (three) times daily. Take after meals. 6 tablet 0   phentermine (ADIPEX-P) 37.5 MG tablet TAKE 1 TABLET BY MOUTH  EVERY MORNING 30 tablet 2   phentermine (ADIPEX-P) 37.5 MG tablet TAKE 1 TABLET BY MOUTH EVERY MORNING 30 tablet 0   valACYclovir (VALTREX) 500 MG tablet Take 1 tablet by mouth twice a day for 3 days as needed for outbreak 30 tablet 6   Current Facility-Administered Medications  Medication Dose Route Frequency Provider Last Rate Last Admin   0.9 %  sodium chloride infusion  500 mL Intravenous Continuous Daryel November, MD        Allergies as of 07/11/2021   (No Known Allergies)    Family History  Problem Relation Age of Onset   Narcolepsy Mother    Diabetes Father    Liver disease Sister        from birth control   Liver disease Brother        fatty liver?   Heart disease Maternal Grandmother        MI   Stroke Maternal Grandmother        x2   Heart disease Maternal Grandfather        MI   Heart disease Paternal Grandmother        MI   Heart disease Paternal Grandfather        MI   Colon cancer Neg Hx    Colon polyps Neg Hx    Esophageal cancer Neg Hx    Rectal cancer Neg Hx    Stomach cancer Neg Hx     Social History   Socioeconomic History   Marital status: Married    Spouse name: Keri   Number of children: 2   Years of education: Not on file   Highest education level: Not on file  Occupational History   Occupation: Aeronautical engineer: [employer]  Tobacco Use   Smoking status: Never   Smokeless tobacco: Never  Vaping Use   Vaping Use: Never used  Substance and Sexual Activity   Alcohol use: Yes    Comment: 1-2 x every month or so   Drug use: Never   Sexual activity: Yes    Partners: Male  Other Topics Concern   Not on file  Social History Narrative   Drinks maybe 1 caffeine. Exercises 3 x a week. Training for a 5K.     Social Determinants of Health   Financial Resource Strain: Not on file  Food Insecurity: Not on file  Transportation Needs: Not on file  Physical Activity: Not on file  Stress: Not on file   Social Connections: Not on file  Intimate Partner Violence: Not on file    Review of Systems:  All other review of systems negative except as mentioned in the HPI.  Physical Exam: Vital signs BP (!) 110/57  Pulse 64    Temp (!) 97.5 F (36.4 C)    Ht 5\' 3"  (1.6 m)    Wt 143 lb 12.8 oz (65.2 kg)    SpO2 100%    BMI 25.47 kg/m   General:   Alert,  Well-developed, well-nourished, pleasant and cooperative in NAD Airway:  Mallampati 2 Lungs:  Clear throughout to auscultation.   Heart:  Regular rate and rhythm; no murmurs, clicks, rubs,  or gallops. Abdomen:  Soft, nontender and nondistended. Normal bowel sounds.   Neuro/Psych:  Normal mood and affect. A and O x 3   Shahd Occhipinti E. Candis Schatz, MD [facility]

## 2021-07-11 NOTE — Progress Notes (Signed)
Called to room to assist during endoscopic procedure.  Patient ID and intended procedure confirmed with present staff. Received instructions for my participation in the procedure from the performing physician.  

## 2021-07-11 NOTE — Op Note (Signed)
[place] Patient Name: Miranda Cabrera Procedure Date: 07/11/2021 11:25 AM MRN: [id number] Endoscopist: Nicki Reaper E. Candis Schatz , MD Age: 56 Referring MD:  Date of Birth: 12/30/1965 Gender: Female Account #: [id number] Procedure:                Colonoscopy Indications:              Screening for colorectal malignant neoplasm, This                            is the patient's first colonoscopy Medicines:                Monitored Anesthesia Care Procedure:                Pre-Anesthesia Assessment:                           - Prior to the procedure, a History and Physical                            was performed, and patient medications and                            allergies were reviewed. The patient's tolerance of                            previous anesthesia was also reviewed. The risks                            and benefits of the procedure and the sedation                            options and risks were discussed with the patient.                            All questions were answered, and informed consent                            was obtained. Prior Anticoagulants: The patient has                            taken no previous anticoagulant or antiplatelet                            agents. ASA Grade Assessment: II - A patient with                            mild systemic disease. After reviewing the risks                            and benefits, the patient was deemed in                            satisfactory condition to undergo the procedure.  After obtaining informed consent, the colonoscope                            was passed under direct vision. Throughout the                            procedure, the patient's blood pressure, pulse, and                            oxygen saturations were monitored continuously. The                            Olympus CF-HQ190L ([id number]) Colonoscope was                            introduced through the  anus and advanced to the the                            terminal ileum, with identification of the                            appendiceal orifice and IC valve. The colonoscopy                            was performed without difficulty. The patient                            tolerated the procedure well. The quality of the                            bowel preparation was good. The terminal ileum,                            ileocecal valve, appendiceal orifice, and rectum                            were photographed. The bowel preparation used was                            SUPREP via split dose instruction. Scope In: 11:43:42 AM Scope Out: 12:04:22 PM Scope Withdrawal Time: 0 hours 14 minutes 57 seconds  Total Procedure Duration: 0 hours 20 minutes 40 seconds  Findings:                 The perianal and digital rectal examinations were                            normal. Pertinent negatives include normal                            sphincter tone and no palpable rectal lesions.                           One flat and two sessile polyps were found in the  descending colon. The polyps were 2 to 5 mm in                            size. These polyps were removed with a cold snare.                            Resection and retrieval were complete. Estimated                            blood loss was minimal.                           A 3 mm polyp was found in the cecum. The polyp was                            sessile. The polyp was removed with a cold snare.                            Resection and retrieval were complete. Estimated                            blood loss was minimal.                           A 6 mm polyp was found in the sigmoid colon. The                            polyp was sessile. The polyp was removed with a                            cold snare. Resection and retrieval were complete.                            Estimated blood loss was minimal.                            Multiple small and large-mouthed diverticula were                            found in the sigmoid colon and descending colon.                            There was no evidence of diverticular bleeding.                           The exam was otherwise normal throughout the                            examined colon.                           The terminal ileum appeared normal.  The retroflexed view of the distal rectum and anal                            verge was normal and showed no anal or rectal                            abnormalities. Complications:            No immediate complications. Estimated Blood Loss:     Estimated blood loss: none. Estimated blood loss                            was minimal. Impression:               - Three 2 to 5 mm polyps in the descending colon,                            removed with a cold snare. Resected and retrieved.                           - One 3 mm polyp in the cecum, removed with a cold                            snare. Resected and retrieved.                           - One 6 mm polyp in the sigmoid colon, removed with                            a cold snare. Resected and retrieved.                           - Moderate diverticulosis in the sigmoid colon and                            in the descending colon. There was no evidence of                            diverticular bleeding.                           - The examined portion of the ileum was normal.                           - The distal rectum and anal verge are normal on                            retroflexion view. Recommendation:           - Discharge patient to home (with escort).                           - Resume previous diet.                           -  Continue present medications.                           - Await pathology results.                           - Repeat colonoscopy (date not yet determined) for                             surveillance based on pathology results. Jonn Chaikin E. Candis Schatz, MD 07/11/2021 12:14:46 PM This report has been signed electronically.

## 2021-07-11 NOTE — Progress Notes (Signed)
PT taken to PACU. Monitors in place. VSS. Report given to RN. 

## 2021-07-13 ENCOUNTER — Telehealth: Payer: Self-pay | Admitting: *Deleted

## 2021-07-13 NOTE — Telephone Encounter (Signed)
°  Follow up Call-  Call back number 07/11/2021  Post procedure Call Back phone  # [phone]  Permission to leave phone message Yes  Some recent data might be hidden     Patient questions:  Do you have a fever, pain , or abdominal swelling? No. Pain Score  0 *  Have you tolerated food without any problems? Yes.    Have you been able to return to your normal activities? Yes.    Do you have any questions about your discharge instructions: Diet   No. Medications  No. Follow up visit  No.  Do you have questions or concerns about your Care? No.  Actions: * If pain score is 4 or above: No action needed, pain <4.  Have you developed a fever since your procedure? no  2.   Have you had an respiratory symptoms (SOB or cough) since your procedure? no  3.   Have you tested positive for COVID 19 since your procedure no  4.   Have you had any family members/close contacts diagnosed with the COVID 19 since your procedure?  no   If yes to any of these questions please route to Joylene John, RN and Joella Prince, RN

## 2021-07-14 NOTE — Progress Notes (Signed)
Rowan,   Four of the five polyps that I removed during your recent procedure were completely benign but were proven to be "pre-cancerous" polyps that MAY have grown into cancers if they had not been removed.  Studies shows that at least 20% of women over age 54 and 30% of men over age 29 have pre-cancerous polyps.  Based on current nationally recognized surveillance guidelines, I recommend that you have a repeat colonoscopy in 5 years.   If you develop any new rectal bleeding, abdominal pain or significant bowel habit changes, please contact me before then.

## 2021-08-21 ENCOUNTER — Other Ambulatory Visit (HOSPITAL_COMMUNITY): Payer: Self-pay

## 2021-11-21 ENCOUNTER — Other Ambulatory Visit (HOSPITAL_COMMUNITY): Payer: Self-pay

## 2022-01-12 ENCOUNTER — Other Ambulatory Visit: Payer: Self-pay | Admitting: Obstetrics and Gynecology

## 2022-01-12 DIAGNOSIS — Z1231 Encounter for screening mammogram for malignant neoplasm of breast: Secondary | ICD-10-CM

## 2022-01-24 ENCOUNTER — Ambulatory Visit
Admission: RE | Admit: 2022-01-24 | Discharge: 2022-01-24 | Disposition: A | Discharge status: Home / Self Care | Payer: 59 | Source: Ambulatory Visit

## 2022-01-24 DIAGNOSIS — Z1231 Encounter for screening mammogram for malignant neoplasm of breast: Secondary | ICD-10-CM | POA: Diagnosis not present

## 2022-02-19 ENCOUNTER — Other Ambulatory Visit (HOSPITAL_COMMUNITY): Payer: Self-pay

## 2022-02-19 MED ORDER — METOPROLOL SUCCINATE ER 25 MG PO TB24
25.0000 mg | ORAL_TABLET | Freq: Every day | ORAL | 1 refills | Status: DC
  Filled 2022-02-19: qty 90, 90d supply, fill #0
  Filled 2022-05-28: qty 90, 90d supply, fill #1

## 2022-03-29 DIAGNOSIS — Z6827 Body mass index (BMI) 27.0-27.9, adult: Secondary | ICD-10-CM | POA: Diagnosis not present

## 2022-03-29 DIAGNOSIS — Z01419 Encounter for gynecological examination (general) (routine) without abnormal findings: Secondary | ICD-10-CM | POA: Diagnosis not present

## 2022-05-14 ENCOUNTER — Other Ambulatory Visit: Payer: Self-pay | Admitting: Family Medicine

## 2022-05-14 ENCOUNTER — Other Ambulatory Visit (HOSPITAL_COMMUNITY): Payer: Self-pay

## 2022-05-14 DIAGNOSIS — Z13220 Encounter for screening for lipoid disorders: Secondary | ICD-10-CM | POA: Diagnosis not present

## 2022-05-14 DIAGNOSIS — M8580 Other specified disorders of bone density and structure, unspecified site: Secondary | ICD-10-CM | POA: Diagnosis not present

## 2022-05-14 DIAGNOSIS — M8588 Other specified disorders of bone density and structure, other site: Secondary | ICD-10-CM | POA: Diagnosis not present

## 2022-05-14 DIAGNOSIS — Z0189 Encounter for other specified special examinations: Secondary | ICD-10-CM | POA: Diagnosis not present

## 2022-05-14 DIAGNOSIS — B009 Herpesviral infection, unspecified: Secondary | ICD-10-CM | POA: Diagnosis not present

## 2022-05-14 DIAGNOSIS — Z1159 Encounter for screening for other viral diseases: Secondary | ICD-10-CM | POA: Diagnosis not present

## 2022-05-14 DIAGNOSIS — G479 Sleep disorder, unspecified: Secondary | ICD-10-CM | POA: Diagnosis not present

## 2022-05-14 DIAGNOSIS — I493 Ventricular premature depolarization: Secondary | ICD-10-CM | POA: Diagnosis not present

## 2022-05-14 DIAGNOSIS — R059 Cough, unspecified: Secondary | ICD-10-CM | POA: Diagnosis not present

## 2022-05-14 DIAGNOSIS — K5790 Diverticulosis of intestine, part unspecified, without perforation or abscess without bleeding: Secondary | ICD-10-CM | POA: Diagnosis not present

## 2022-05-14 MED ORDER — AMOXICILLIN-POT CLAVULANATE 875-125 MG PO TABS
1.0000 | ORAL_TABLET | Freq: Two times a day (BID) | ORAL | 0 refills | Status: AC
  Filled 2022-05-14: qty 14, 7d supply, fill #0

## 2022-05-14 MED ORDER — HYDROCODONE BIT-HOMATROP MBR 5-1.5 MG/5ML PO SOLN
5.0000 mL | Freq: Four times a day (QID) | ORAL | 0 refills | Status: AC | PRN
  Filled 2022-05-14: qty 120, 6d supply, fill #0

## 2022-05-14 MED ORDER — VALACYCLOVIR HCL 1 G PO TABS
2000.0000 mg | ORAL_TABLET | Freq: Two times a day (BID) | ORAL | 1 refills | Status: AC
  Filled 2022-05-14: qty 4, 1d supply, fill #0

## 2022-06-19 DIAGNOSIS — R945 Abnormal results of liver function studies: Secondary | ICD-10-CM | POA: Diagnosis not present

## 2022-06-19 DIAGNOSIS — R7309 Other abnormal glucose: Secondary | ICD-10-CM | POA: Diagnosis not present

## 2022-08-12 ENCOUNTER — Other Ambulatory Visit (HOSPITAL_COMMUNITY): Payer: Self-pay

## 2022-08-13 ENCOUNTER — Other Ambulatory Visit (HOSPITAL_COMMUNITY): Payer: Self-pay

## 2022-08-13 MED ORDER — METOPROLOL SUCCINATE ER 25 MG PO TB24
25.0000 mg | ORAL_TABLET | Freq: Every day | ORAL | 0 refills | Status: DC
  Filled 2022-08-13: qty 90, 90d supply, fill #0

## 2022-09-20 DIAGNOSIS — M940 Chondrocostal junction syndrome [Tietze]: Secondary | ICD-10-CM | POA: Diagnosis not present

## 2022-09-20 DIAGNOSIS — I341 Nonrheumatic mitral (valve) prolapse: Secondary | ICD-10-CM | POA: Diagnosis not present

## 2022-09-20 DIAGNOSIS — R7309 Other abnormal glucose: Secondary | ICD-10-CM | POA: Diagnosis not present

## 2022-09-20 DIAGNOSIS — M8588 Other specified disorders of bone density and structure, other site: Secondary | ICD-10-CM | POA: Diagnosis not present

## 2022-10-31 ENCOUNTER — Ambulatory Visit
Admission: RE | Admit: 2022-10-31 | Discharge: 2022-10-31 | Disposition: A | Discharge status: Home / Self Care | Payer: 59 | Source: Ambulatory Visit | Attending: Family Medicine | Admitting: Family Medicine

## 2022-10-31 DIAGNOSIS — N951 Menopausal and female climacteric states: Secondary | ICD-10-CM | POA: Diagnosis not present

## 2022-10-31 DIAGNOSIS — E349 Endocrine disorder, unspecified: Secondary | ICD-10-CM | POA: Diagnosis not present

## 2022-10-31 DIAGNOSIS — M810 Age-related osteoporosis without current pathological fracture: Secondary | ICD-10-CM | POA: Diagnosis not present

## 2022-10-31 DIAGNOSIS — M8580 Other specified disorders of bone density and structure, unspecified site: Secondary | ICD-10-CM

## 2022-11-19 ENCOUNTER — Other Ambulatory Visit (HOSPITAL_COMMUNITY): Payer: Self-pay

## 2022-11-19 MED ORDER — METOPROLOL SUCCINATE ER 25 MG PO TB24
25.0000 mg | ORAL_TABLET | Freq: Every day | ORAL | 3 refills | Status: DC
  Filled 2022-11-19: qty 90, 90d supply, fill #0
  Filled 2023-02-21: qty 90, 90d supply, fill #1
  Filled 2023-06-01: qty 90, 90d supply, fill #2
  Filled 2023-08-27: qty 90, 90d supply, fill #3

## 2022-11-20 ENCOUNTER — Other Ambulatory Visit (HOSPITAL_COMMUNITY): Payer: Self-pay

## 2022-12-18 ENCOUNTER — Other Ambulatory Visit: Payer: Self-pay | Admitting: Obstetrics and Gynecology

## 2022-12-18 DIAGNOSIS — Z1231 Encounter for screening mammogram for malignant neoplasm of breast: Secondary | ICD-10-CM

## 2023-01-29 ENCOUNTER — Ambulatory Visit
Admission: RE | Admit: 2023-01-29 | Discharge: 2023-01-29 | Disposition: A | Discharge status: Home / Self Care | Payer: 59 | Source: Ambulatory Visit

## 2023-01-29 DIAGNOSIS — Z1231 Encounter for screening mammogram for malignant neoplasm of breast: Secondary | ICD-10-CM | POA: Diagnosis not present

## 2023-02-17 ENCOUNTER — Encounter (HOSPITAL_BASED_OUTPATIENT_CLINIC_OR_DEPARTMENT_OTHER): Payer: Self-pay

## 2023-02-17 ENCOUNTER — Other Ambulatory Visit: Payer: Self-pay

## 2023-02-17 ENCOUNTER — Emergency Department (HOSPITAL_BASED_OUTPATIENT_CLINIC_OR_DEPARTMENT_OTHER)
Admission: EM | Admit: 2023-02-17 | Discharge: 2023-02-17 | Disposition: A | Discharge status: Home / Self Care | Payer: 59 | Attending: Emergency Medicine | Admitting: Emergency Medicine

## 2023-02-17 DIAGNOSIS — R197 Diarrhea, unspecified: Secondary | ICD-10-CM | POA: Diagnosis not present

## 2023-02-17 DIAGNOSIS — R202 Paresthesia of skin: Secondary | ICD-10-CM | POA: Diagnosis not present

## 2023-02-17 DIAGNOSIS — R112 Nausea with vomiting, unspecified: Secondary | ICD-10-CM | POA: Diagnosis not present

## 2023-02-17 HISTORY — DX: Type 2 diabetes mellitus without complications: E11.9

## 2023-02-17 LAB — MAGNESIUM: Magnesium: 1.8 mg/dL (ref 1.7–2.4)

## 2023-02-17 LAB — CBC WITH DIFFERENTIAL/PLATELET
Abs Immature Granulocytes: 0.01 10*3/uL (ref 0.00–0.07)
Basophils Absolute: 0 10*3/uL (ref 0.0–0.1)
Basophils Relative: 0 %
Eosinophils Absolute: 0 10*3/uL (ref 0.0–0.5)
Eosinophils Relative: 1 %
HCT: 37.4 % (ref 36.0–46.0)
Hemoglobin: 12.9 g/dL (ref 12.0–15.0)
Immature Granulocytes: 0 %
Lymphocytes Relative: 32 %
Lymphs Abs: 1.9 10*3/uL (ref 0.7–4.0)
MCH: 31.1 pg (ref 26.0–34.0)
MCHC: 34.5 g/dL (ref 30.0–36.0)
MCV: 90.1 fL (ref 80.0–100.0)
Monocytes Absolute: 0.3 10*3/uL (ref 0.1–1.0)
Monocytes Relative: 5 %
Neutro Abs: 3.6 10*3/uL (ref 1.7–7.7)
Neutrophils Relative %: 62 %
Platelets: 233 10*3/uL (ref 150–400)
RBC: 4.15 MIL/uL (ref 3.87–5.11)
RDW: 12.2 % (ref 11.5–15.5)
WBC: 5.9 10*3/uL (ref 4.0–10.5)
nRBC: 0 % (ref 0.0–0.2)

## 2023-02-17 LAB — COMPREHENSIVE METABOLIC PANEL
ALT: 24 U/L (ref 0–44)
AST: 21 U/L (ref 15–41)
Albumin: 4.5 g/dL (ref 3.5–5.0)
Alkaline Phosphatase: 75 U/L (ref 38–126)
Anion gap: 12 (ref 5–15)
BUN: 20 mg/dL (ref 6–20)
CO2: 24 mmol/L (ref 22–32)
Calcium: 9.4 mg/dL (ref 8.9–10.3)
Chloride: 102 mmol/L (ref 98–111)
Creatinine, Ser: 0.95 mg/dL (ref 0.44–1.00)
GFR, Estimated: >60 mL/min (ref >60)
Glucose, Bld: 187 mg/dL — ABNORMAL HIGH (ref 70–99)
Potassium: 3.5 mmol/L (ref 3.5–5.1)
Sodium: 138 mmol/L (ref 135–145)
Total Bilirubin: 0.5 mg/dL (ref 0.3–1.2)
Total Protein: 7.1 g/dL (ref 6.5–8.1)

## 2023-02-17 LAB — LIPASE, BLOOD: Lipase: 17 U/L (ref 11–51)

## 2023-02-17 MED ORDER — MORPHINE SULFATE (PF) 2 MG/ML IV SOLN
2.0000 mg | Freq: Once | INTRAVENOUS | Status: AC
  Administered 2023-02-17: 2 mg via INTRAVENOUS
  Filled 2023-02-17: qty 1

## 2023-02-17 MED ORDER — ONDANSETRON 4 MG PO TBDP: ORAL_TABLET | ORAL | 0 refills | Status: AC

## 2023-02-17 MED ORDER — ONDANSETRON HCL 4 MG/2ML IJ SOLN
4.0000 mg | Freq: Once | INTRAMUSCULAR | Status: AC
  Administered 2023-02-17: 4 mg via INTRAVENOUS
  Filled 2023-02-17: qty 2

## 2023-02-17 MED ORDER — SODIUM CHLORIDE 0.9 % IV BOLUS
1000.0000 mL | Freq: Once | INTRAVENOUS | Status: AC
  Administered 2023-02-17: 1000 mL via INTRAVENOUS

## 2023-02-17 NOTE — ED Provider Notes (Signed)
[place] EMERGENCY DEPARTMENT AT [facility] Provider Note   CSN: [id number] Arrival date & time: 02/17/23  1959     History  Chief Complaint  Patient presents with   Medication Reaction    COVID +    Miranda Cabrera is a 57 y.o. female.  57 yo F with a chief complaints of nausea vomiting and diarrhea.  Patient thinks this is a reaction to the Paxlovid.  She was initially started on this for COVID.  Had an episode where she vomited and then felt very weak.  She was unable to call for her husband at that time.  Felt like she was tingly from her hands to her mouth.  She now feels a bit better.  Still feels like her hands may be a little bit tingly.        Home Medications Prior to Admission medications   Medication Sig Start Date End Date Taking? Authorizing Provider  ondansetron (ZOFRAN-ODT) 4 MG disintegrating tablet 4mg  ODT q4 hours prn nausea/vomit 02/17/23  Yes Melene Plan, DO  amoxicillin-clavulanate (AUGMENTIN) 875-125 MG tablet Take 1 tablet by mouth every 12 (twelve) hours for 7 days 05/14/22     Calcium Carb-Cholecalciferol 500-10 MG-MCG TABS Take by mouth.    [provider]  fluticasone (FLONASE) 50 MCG/ACT nasal spray 1 spray in each nostril    [provider]  HYDROcodone bit-homatropine (HYCODAN) 5-1.5 MG/5ML syrup Take 5 mLs by mouth every 6 (six) hours as needed. 05/14/22     loratadine (CLARITIN) 10 MG tablet Take 10 mg by mouth daily.    [provider]  metoprolol succinate (TOPROL-XL) 25 MG 24 hr tablet 1 tablet    [provider]  metoprolol succinate (TOPROL-XL) 25 MG 24 hr tablet Take 1 tablet (25 mg) by mouth daily 11/19/22     Multiple Vitamins-Minerals (WOMENS MULTI PO) Take by mouth.    [provider]  PFIZER COVID-19 VAC BIVALENT injection  04/09/21   [provider]  valACYclovir (VALTREX) 1000 MG tablet Take 2 tablets (2,000 mg total) by mouth 2 (two) times daily. 05/14/22     valACYclovir  (VALTREX) 500 MG tablet Take 1 tablet by mouth twice a day for 3 days as needed for outbreak 11/09/20     vitamin C (ASCORBIC ACID) 500 MG tablet Take 500 mg by mouth daily.    [provider]      Allergies    Patient has no known allergies.    Review of Systems   Review of Systems  Physical Exam Updated Vital Signs BP (!) 127/58   Pulse 72   Temp 98 F (36.7 C) (Oral)   Resp 18   Ht 5\' 3"  (1.6 m)   Wt 63.5 kg   SpO2 98%   BMI 24.80 kg/m  Physical Exam Vitals and nursing note reviewed.  Constitutional:      General: She is not in acute distress.    Appearance: She is well-developed. She is not diaphoretic.  HENT:     Head: Normocephalic and atraumatic.  Eyes:     Pupils: Pupils are equal, round, and reactive to light.  Cardiovascular:     Rate and Rhythm: Normal rate and regular rhythm.     Heart sounds: No murmur heard.    No friction rub. No gallop.  Pulmonary:     Effort: Pulmonary effort is normal.     Breath sounds: No wheezing or rales.  Abdominal:     General: There  is no distension.     Palpations: Abdomen is soft.     Tenderness: There is no abdominal tenderness.  Musculoskeletal:        General: No tenderness.     Cervical back: Normal range of motion and neck supple.  Skin:    General: Skin is warm and dry.  Neurological:     Mental Status: She is alert and oriented to person, place, and time.  Psychiatric:        Behavior: Behavior normal.     ED Results / Procedures / Treatments   Labs (all labs ordered are listed, but only abnormal results are displayed) Labs Reviewed  COMPREHENSIVE METABOLIC PANEL - Abnormal; Notable for the following components:      Result Value   Glucose, Bld 187 (*)    All other components within normal limits  CBC WITH DIFFERENTIAL/PLATELET  LIPASE, BLOOD  MAGNESIUM    EKG None  Radiology No results found.  Procedures Procedures    Medications Ordered in ED Medications  sodium chloride 0.9 %  bolus 1,000 mL (1,000 mLs Intravenous New Bag/Given 02/17/23 2031)  ondansetron [facility]) injection 4 mg (4 mg Intravenous Given 02/17/23 2034)  morphine (PF) 2 MG/ML injection 2 mg (2 mg Intravenous Given 02/17/23 2034)    ED Course/ Medical Decision Making/ A&P                                 Medical Decision Making Amount and/or Complexity of Data Reviewed Labs: ordered.  Risk Prescription drug management.   83 yoF with a chief complaints of nausea vomiting and diarrhea.  She thinks most likely this is a side effect of Paxlovid.  She also had an episode that sounds like vasovagal versus perhaps a panic attack.  Will obtain a laboratory evaluation here.  Bolus of IV fluids.  Reassess.  Patient is feeling quite a bit better on repeat assessment.  She has no significant electrolyte abnormalities LFTs and lipase are unremarkable.  No anemia.  Will discharge home.  PCP follow-up.  9:36 PM:  I have discussed the diagnosis/risks/treatment options with the patient and family.  Evaluation and diagnostic testing in the emergency department does not suggest an emergent condition requiring admission or immediate intervention beyond what has been performed at this time.  They will follow up with PCP. We also discussed returning to the ED immediately if new or worsening sx occur. We discussed the sx which are most concerning (e.g., sudden worsening pain, fever, inability to tolerate by mouth) that necessitate immediate return. Medications administered to the patient during their visit and any new prescriptions provided to the patient are listed below.  Medications given during this visit Medications  sodium chloride 0.9 % bolus 1,000 mL (1,000 mLs Intravenous New Bag/Given 02/17/23 2031)  ondansetron [facility]) injection 4 mg (4 mg Intravenous Given 02/17/23 2034)  morphine (PF) 2 MG/ML injection 2 mg (2 mg Intravenous Given 02/17/23 2034)     The patient appears reasonably screen and/or stabilized for  discharge and I doubt any other medical condition or other [facility] requiring further screening, evaluation, or treatment in the ED at this time prior to discharge.          Final Clinical Impression(s) / ED Diagnoses Final diagnoses:  Nausea vomiting and diarrhea    Rx / DC Orders ED Discharge Orders          Ordered    ondansetron (  ZOFRAN-ODT) 4 MG disintegrating tablet        02/17/23 2128              Melene Plan, DO 02/17/23 2136

## 2023-02-17 NOTE — Discharge Instructions (Addendum)
I have prescribed you something for nausea.  You can take Imodium for diarrhea.  Please return for inability to eat or drink or sudden worsening abdominal discomfort.  I would have you stop taking the Paxlovid.

## 2023-02-17 NOTE — ED Triage Notes (Signed)
Pt started having COVID symptoms on 9/4 and had a positive home test. Pt was Rx Paxlovid which was started on 9/5. Today pt started having abd cramps with diarrhea and vomiting. Pt states she was hot and hyperventilating and started having hand spasms.

## 2023-02-25 ENCOUNTER — Other Ambulatory Visit (HOSPITAL_COMMUNITY): Payer: Self-pay

## 2023-04-08 DIAGNOSIS — Z6826 Body mass index (BMI) 26.0-26.9, adult: Secondary | ICD-10-CM | POA: Diagnosis not present

## 2023-04-08 DIAGNOSIS — Z01419 Encounter for gynecological examination (general) (routine) without abnormal findings: Secondary | ICD-10-CM | POA: Diagnosis not present

## 2023-05-17 DIAGNOSIS — R7309 Other abnormal glucose: Secondary | ICD-10-CM | POA: Diagnosis not present

## 2023-05-17 DIAGNOSIS — E785 Hyperlipidemia, unspecified: Secondary | ICD-10-CM | POA: Diagnosis not present

## 2023-05-17 DIAGNOSIS — I341 Nonrheumatic mitral (valve) prolapse: Secondary | ICD-10-CM | POA: Diagnosis not present

## 2023-05-17 DIAGNOSIS — M8588 Other specified disorders of bone density and structure, other site: Secondary | ICD-10-CM | POA: Diagnosis not present

## 2023-05-20 ENCOUNTER — Other Ambulatory Visit (HOSPITAL_COMMUNITY): Payer: Self-pay

## 2023-05-20 DIAGNOSIS — Z23 Encounter for immunization: Secondary | ICD-10-CM | POA: Diagnosis not present

## 2023-05-20 DIAGNOSIS — R7309 Other abnormal glucose: Secondary | ICD-10-CM | POA: Diagnosis not present

## 2023-05-20 DIAGNOSIS — B009 Herpesviral infection, unspecified: Secondary | ICD-10-CM | POA: Diagnosis not present

## 2023-05-20 DIAGNOSIS — E785 Hyperlipidemia, unspecified: Secondary | ICD-10-CM | POA: Diagnosis not present

## 2023-05-20 DIAGNOSIS — G479 Sleep disorder, unspecified: Secondary | ICD-10-CM | POA: Diagnosis not present

## 2023-05-20 DIAGNOSIS — I341 Nonrheumatic mitral (valve) prolapse: Secondary | ICD-10-CM | POA: Diagnosis not present

## 2023-05-20 DIAGNOSIS — I493 Ventricular premature depolarization: Secondary | ICD-10-CM | POA: Diagnosis not present

## 2023-05-20 DIAGNOSIS — M8588 Other specified disorders of bone density and structure, other site: Secondary | ICD-10-CM | POA: Diagnosis not present

## 2023-05-20 DIAGNOSIS — Z0000 Encounter for general adult medical examination without abnormal findings: Secondary | ICD-10-CM | POA: Diagnosis not present

## 2023-05-20 DIAGNOSIS — E119 Type 2 diabetes mellitus without complications: Secondary | ICD-10-CM | POA: Diagnosis not present

## 2023-05-20 MED ORDER — ROSUVASTATIN CALCIUM 20 MG PO TABS
20.0000 mg | ORAL_TABLET | Freq: Every day | ORAL | 3 refills | Status: AC
  Filled 2023-05-20: qty 90, 90d supply, fill #0

## 2023-05-21 ENCOUNTER — Other Ambulatory Visit (HOSPITAL_COMMUNITY): Payer: Self-pay

## 2023-05-30 ENCOUNTER — Other Ambulatory Visit (HOSPITAL_COMMUNITY): Payer: Self-pay

## 2023-05-30 MED ORDER — EZETIMIBE 10 MG PO TABS
10.0000 mg | ORAL_TABLET | Freq: Every day | ORAL | 3 refills | Status: AC
  Filled 2023-05-30: qty 90, 90d supply, fill #0
  Filled 2023-08-03 – 2023-08-20 (×3): qty 90, 90d supply, fill #1

## 2023-06-03 ENCOUNTER — Other Ambulatory Visit (HOSPITAL_COMMUNITY): Payer: Self-pay

## 2023-06-21 DIAGNOSIS — E785 Hyperlipidemia, unspecified: Secondary | ICD-10-CM | POA: Diagnosis not present

## 2023-08-03 ENCOUNTER — Other Ambulatory Visit (HOSPITAL_COMMUNITY): Payer: Self-pay

## 2023-08-07 ENCOUNTER — Other Ambulatory Visit (HOSPITAL_COMMUNITY): Payer: Self-pay

## 2023-08-07 MED ORDER — EZETIMIBE 10 MG PO TABS
10.0000 mg | ORAL_TABLET | Freq: Every day | ORAL | 3 refills | Status: AC
  Filled 2023-08-07: qty 90, 90d supply, fill #0

## 2023-08-20 ENCOUNTER — Other Ambulatory Visit (HOSPITAL_COMMUNITY): Payer: Self-pay

## 2023-08-27 DIAGNOSIS — E785 Hyperlipidemia, unspecified: Secondary | ICD-10-CM | POA: Diagnosis not present

## 2023-08-28 ENCOUNTER — Other Ambulatory Visit: Payer: Self-pay

## 2023-08-29 ENCOUNTER — Other Ambulatory Visit (HOSPITAL_COMMUNITY): Payer: Self-pay

## 2023-11-18 DIAGNOSIS — I493 Ventricular premature depolarization: Secondary | ICD-10-CM | POA: Diagnosis not present

## 2023-11-18 DIAGNOSIS — E785 Hyperlipidemia, unspecified: Secondary | ICD-10-CM | POA: Diagnosis not present

## 2023-11-18 DIAGNOSIS — M8580 Other specified disorders of bone density and structure, unspecified site: Secondary | ICD-10-CM | POA: Diagnosis not present

## 2023-11-18 DIAGNOSIS — E119 Type 2 diabetes mellitus without complications: Secondary | ICD-10-CM | POA: Diagnosis not present

## 2023-11-22 ENCOUNTER — Other Ambulatory Visit (HOSPITAL_COMMUNITY): Payer: Self-pay

## 2023-11-22 DIAGNOSIS — B009 Herpesviral infection, unspecified: Secondary | ICD-10-CM | POA: Diagnosis not present

## 2023-11-22 DIAGNOSIS — I341 Nonrheumatic mitral (valve) prolapse: Secondary | ICD-10-CM | POA: Diagnosis not present

## 2023-11-22 DIAGNOSIS — M8580 Other specified disorders of bone density and structure, unspecified site: Secondary | ICD-10-CM | POA: Diagnosis not present

## 2023-11-22 DIAGNOSIS — I493 Ventricular premature depolarization: Secondary | ICD-10-CM | POA: Diagnosis not present

## 2023-11-22 DIAGNOSIS — E785 Hyperlipidemia, unspecified: Secondary | ICD-10-CM | POA: Diagnosis not present

## 2023-11-22 DIAGNOSIS — E119 Type 2 diabetes mellitus without complications: Secondary | ICD-10-CM | POA: Diagnosis not present

## 2023-11-22 MED ORDER — NEXLIZET 180-10 MG PO TABS
1.0000 | ORAL_TABLET | Freq: Every day | ORAL | 1 refills | Status: DC
  Filled 2023-11-22 – 2023-11-28 (×3): qty 30, 30d supply, fill #0
  Filled 2023-12-24: qty 30, 30d supply, fill #1

## 2023-11-23 ENCOUNTER — Other Ambulatory Visit (HOSPITAL_COMMUNITY): Payer: Self-pay

## 2023-11-25 ENCOUNTER — Other Ambulatory Visit (HOSPITAL_COMMUNITY): Payer: Self-pay

## 2023-11-25 MED ORDER — METOPROLOL SUCCINATE ER 25 MG PO TB24
25.0000 mg | ORAL_TABLET | Freq: Every day | ORAL | 1 refills | Status: DC
  Filled 2023-11-25: qty 90, 90d supply, fill #0
  Filled 2024-02-23: qty 90, 90d supply, fill #1

## 2023-11-27 ENCOUNTER — Other Ambulatory Visit (HOSPITAL_COMMUNITY): Payer: Self-pay

## 2023-11-27 ENCOUNTER — Encounter (HOSPITAL_COMMUNITY): Payer: Self-pay

## 2023-11-28 ENCOUNTER — Other Ambulatory Visit (HOSPITAL_COMMUNITY): Payer: Self-pay

## 2023-12-16 ENCOUNTER — Other Ambulatory Visit: Payer: Self-pay | Admitting: Obstetrics and Gynecology

## 2023-12-16 DIAGNOSIS — Z1231 Encounter for screening mammogram for malignant neoplasm of breast: Secondary | ICD-10-CM

## 2023-12-27 DIAGNOSIS — I341 Nonrheumatic mitral (valve) prolapse: Secondary | ICD-10-CM | POA: Diagnosis not present

## 2024-01-10 DIAGNOSIS — E785 Hyperlipidemia, unspecified: Secondary | ICD-10-CM | POA: Diagnosis not present

## 2024-01-23 ENCOUNTER — Other Ambulatory Visit (HOSPITAL_COMMUNITY): Payer: Self-pay

## 2024-01-23 MED ORDER — NEXLIZET 180-10 MG PO TABS
1.0000 | ORAL_TABLET | Freq: Every day | ORAL | 1 refills | Status: DC
  Filled 2024-01-23: qty 90, 90d supply, fill #0
  Filled 2024-04-19: qty 90, 90d supply, fill #1

## 2024-01-31 ENCOUNTER — Ambulatory Visit
Admission: RE | Admit: 2024-01-31 | Discharge: 2024-01-31 | Disposition: A | Discharge status: Home / Self Care | Source: Ambulatory Visit

## 2024-01-31 DIAGNOSIS — Z1231 Encounter for screening mammogram for malignant neoplasm of breast: Secondary | ICD-10-CM

## 2024-03-16 ENCOUNTER — Other Ambulatory Visit (HOSPITAL_COMMUNITY): Payer: Self-pay

## 2024-03-16 MED ORDER — VALACYCLOVIR HCL 1 G PO TABS
2000.0000 mg | ORAL_TABLET | Freq: Two times a day (BID) | ORAL | 1 refills | Status: AC | PRN
  Filled 2024-03-16: qty 4, 1d supply, fill #0

## 2024-03-17 ENCOUNTER — Other Ambulatory Visit (HOSPITAL_COMMUNITY): Payer: Self-pay

## 2024-04-13 DIAGNOSIS — M8580 Other specified disorders of bone density and structure, unspecified site: Secondary | ICD-10-CM | POA: Diagnosis not present

## 2024-04-13 DIAGNOSIS — Z780 Asymptomatic menopausal state: Secondary | ICD-10-CM | POA: Diagnosis not present

## 2024-04-13 DIAGNOSIS — Z01419 Encounter for gynecological examination (general) (routine) without abnormal findings: Secondary | ICD-10-CM | POA: Diagnosis not present

## 2024-04-13 DIAGNOSIS — Z6826 Body mass index (BMI) 26.0-26.9, adult: Secondary | ICD-10-CM | POA: Diagnosis not present

## 2024-04-13 DIAGNOSIS — Z124 Encounter for screening for malignant neoplasm of cervix: Secondary | ICD-10-CM | POA: Diagnosis not present

## 2024-04-20 ENCOUNTER — Other Ambulatory Visit (HOSPITAL_COMMUNITY): Payer: Self-pay

## 2024-05-18 DIAGNOSIS — I493 Ventricular premature depolarization: Secondary | ICD-10-CM | POA: Diagnosis not present

## 2024-05-18 DIAGNOSIS — E119 Type 2 diabetes mellitus without complications: Secondary | ICD-10-CM | POA: Diagnosis not present

## 2024-05-18 DIAGNOSIS — E785 Hyperlipidemia, unspecified: Secondary | ICD-10-CM | POA: Diagnosis not present

## 2024-05-18 DIAGNOSIS — M8580 Other specified disorders of bone density and structure, unspecified site: Secondary | ICD-10-CM | POA: Diagnosis not present

## 2024-05-21 DIAGNOSIS — M8580 Other specified disorders of bone density and structure, unspecified site: Secondary | ICD-10-CM | POA: Diagnosis not present

## 2024-05-21 DIAGNOSIS — R002 Palpitations: Secondary | ICD-10-CM | POA: Diagnosis not present

## 2024-05-21 DIAGNOSIS — B009 Herpesviral infection, unspecified: Secondary | ICD-10-CM | POA: Diagnosis not present

## 2024-05-21 DIAGNOSIS — E119 Type 2 diabetes mellitus without complications: Secondary | ICD-10-CM | POA: Diagnosis not present

## 2024-05-21 DIAGNOSIS — Z0189 Encounter for other specified special examinations: Secondary | ICD-10-CM | POA: Diagnosis not present

## 2024-05-21 DIAGNOSIS — I341 Nonrheumatic mitral (valve) prolapse: Secondary | ICD-10-CM | POA: Diagnosis not present

## 2024-05-21 DIAGNOSIS — E785 Hyperlipidemia, unspecified: Secondary | ICD-10-CM | POA: Diagnosis not present

## 2024-05-21 DIAGNOSIS — I493 Ventricular premature depolarization: Secondary | ICD-10-CM | POA: Diagnosis not present

## 2024-05-27 ENCOUNTER — Other Ambulatory Visit (HOSPITAL_COMMUNITY): Payer: Self-pay

## 2024-05-28 ENCOUNTER — Other Ambulatory Visit (HOSPITAL_COMMUNITY): Payer: Self-pay

## 2024-05-28 MED ORDER — METOPROLOL SUCCINATE ER 25 MG PO TB24
25.0000 mg | ORAL_TABLET | Freq: Every day | ORAL | 1 refills | Status: AC
  Filled 2024-05-28: qty 90, 90d supply, fill #0

## 2024-05-29 ENCOUNTER — Other Ambulatory Visit: Payer: Self-pay

## 2024-07-12 ENCOUNTER — Other Ambulatory Visit (HOSPITAL_COMMUNITY): Payer: Self-pay

## 2024-07-13 ENCOUNTER — Other Ambulatory Visit: Payer: Self-pay

## 2024-07-13 ENCOUNTER — Other Ambulatory Visit (HOSPITAL_COMMUNITY): Payer: Self-pay

## 2024-07-13 MED ORDER — NEXLIZET 180-10 MG PO TABS
1.0000 | ORAL_TABLET | Freq: Every day | ORAL | 1 refills | Status: AC
  Filled 2024-07-13: qty 90, 90d supply, fill #0
